# Patient Record
Sex: Male | Born: 1990
Health system: Southern US, Community
[De-identification: ages and names within clinical notes are randomized; demographics above are authoritative.]

## PROBLEM LIST (undated history)

## (undated) DIAGNOSIS — J45909 Unspecified asthma, uncomplicated: Secondary | ICD-10-CM

## (undated) DIAGNOSIS — Z789 Other specified health status: Secondary | ICD-10-CM

## (undated) HISTORY — PX: HERNIA REPAIR: SHX51

## (undated) HISTORY — DX: Unspecified asthma, uncomplicated: J45.909

## (undated) HISTORY — PX: NO PAST SURGERIES: SHX2092

---
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
## 2012-02-24 ENCOUNTER — Encounter (INDEPENDENT_AMBULATORY_CARE_PROVIDER_SITE_OTHER): Payer: Self-pay | Admitting: Surgery

## 2012-02-24 ENCOUNTER — Ambulatory Visit (INDEPENDENT_AMBULATORY_CARE_PROVIDER_SITE_OTHER): Payer: BC Managed Care – PPO | Admitting: Surgery

## 2012-02-24 VITALS — BP 120/62 | HR 64 | Temp 97.7°F | Resp 16 | Ht 67.0 in | Wt 199.4 lb

## 2012-02-24 DIAGNOSIS — K4020 Bilateral inguinal hernia, without obstruction or gangrene, not specified as recurrent: Secondary | ICD-10-CM

## 2012-02-24 NOTE — Progress Notes (Signed)
Patient ID: Jack Jensen, male   DOB: 1990/08/16, 21 y.o.   MRN: [id number]  Chief Complaint  Patient presents with  . New Evaluation    eval ing hermia    HPI Jack Jensen is a 21 y.o. male.   HPI He is a self-referral with bilateral inguinal pain hurting down into the scrotum. This is been occurring for several weeks. He was examined by some physician who felt he had hernia. His mother actually referred him here. He describes the pain as sharp and intermittent. He may have noticed a bulge in the left groin. He has no obstructive symptoms. The pain is mild to moderate in intensity. History reviewed. No pertinent past medical history.  History reviewed. No pertinent past surgical history.  History reviewed. No pertinent family history.  Social History History  Substance Use Topics  . Smoking status: Never Smoker   . Smokeless tobacco: Not on file  . Alcohol Use: Yes     Comment: socially    Allergies  Allergen Reactions  . Cephalexin Hives    Current Outpatient Prescriptions  Medication Sig Dispense Refill  . methylphenidate (RITALIN) 10 MG tablet Take 10 mg by mouth 2 (two) times daily.        Review of Systems Review of Systems  Constitutional: Negative for fever, chills and unexpected weight change.  HENT: Negative for hearing loss, congestion, sore throat, trouble swallowing and voice change.   Eyes: Negative for visual disturbance.  Respiratory: Negative for cough and wheezing.   Cardiovascular: Negative for chest pain, palpitations and leg swelling.  Gastrointestinal: Negative for nausea, vomiting, abdominal pain, diarrhea, constipation, blood in stool, abdominal distention, anal bleeding and rectal pain.  Genitourinary: Negative for hematuria and difficulty urinating.  Musculoskeletal: Negative for arthralgias.  Skin: Negative for rash and wound.  Neurological: Negative for seizures, syncope, weakness and headaches.  Hematological: Negative for adenopathy. Does  not bruise/bleed easily.  Psychiatric/Behavioral: Negative for confusion.    Blood pressure 120/62, pulse 64, temperature 97.7 F (36.5 C), temperature source Temporal, resp. rate 16, height 5\' 7"  (1.702 m), weight 199 lb 6.4 oz (90.447 kg).  Physical Exam Physical Exam  Constitutional: He is oriented to person, place, and time. He appears well-developed and well-nourished. No distress.  HENT:  Head: Normocephalic and atraumatic.  Right Ear: External ear normal.  Left Ear: External ear normal.  Nose: Nose normal.  Mouth/Throat: Oropharynx is clear and moist.  Eyes: Conjunctivae normal are normal. Pupils are equal, round, and reactive to light. Right eye exhibits no discharge. Left eye exhibits no discharge. No scleral icterus.  Neck: Normal range of motion. Neck supple. No tracheal deviation present. No thyromegaly present.  Cardiovascular: Normal rate, regular rhythm, normal heart sounds and intact distal pulses.   No murmur heard. Pulmonary/Chest: Effort normal and breath sounds normal. No respiratory distress. He has no wheezes. He has no rales.  Abdominal: Soft. Bowel sounds are normal. He exhibits no distension. There is no tenderness. There is no rebound and no guarding.       There are very small bilateral inguinal hernias with the left being slightly larger than the right  Musculoskeletal: Normal range of motion. He exhibits no edema and no tenderness.  Lymphadenopathy:    He has no cervical adenopathy.  Neurological: He is alert and oriented to person, place, and time.  Skin: Skin is warm and dry. No rash noted. He is not diaphoretic. No erythema.  Psychiatric: His behavior is normal. Judgment normal.  Data Reviewed   Assessment    Bilateral inguinal hernias    Plan    Repair with mesh was recommended. I discussed the laparoscopic and open techniques. As it is bilateral, I have recommended laparoscopic repair with mesh. I discussed the surgery in detail. I  discussed the risks of surgery which includes but is not limited to bleeding, infection, nerve entrapment, chronic pain, and injury to stranding structures, recurrence, etc. He understands and wishes to proceed. Surgery will be scheduled. Likelihood of success is good       Jack Jensen A 02/24/2012, 3:09 PM

## 2012-03-04 ENCOUNTER — Ambulatory Visit (INDEPENDENT_AMBULATORY_CARE_PROVIDER_SITE_OTHER): Payer: Self-pay | Admitting: General Surgery

## 2012-03-11 ENCOUNTER — Encounter (HOSPITAL_BASED_OUTPATIENT_CLINIC_OR_DEPARTMENT_OTHER): Payer: Self-pay | Admitting: *Deleted

## 2012-03-15 NOTE — H&P (Signed)
Patient ID: Jack Jensen, male DOB: [birth year], 21 y.o. MRN: [id number]  Chief Complaint   Patient presents with   .  New Evaluation     eval ing hermia    HPI  Jack Jensen is a 21 y.o. male.  HPI  He is a self-referral with bilateral inguinal pain hurting down into the scrotum. This is been occurring for several weeks. He was examined by some physician who felt he had hernia. His mother actually referred him here. He describes the pain as sharp and intermittent. He may have noticed a bulge in the left groin. He has no obstructive symptoms. The pain is mild to moderate in intensity.  History reviewed. No pertinent past medical history.  History reviewed. No pertinent past surgical history.  History reviewed. No pertinent family history.  Social History  History   Substance Use Topics   .  Smoking status:  Never Smoker   .  Smokeless tobacco:  Not on file   .  Alcohol Use:  Yes      Comment: socially    Allergies   Allergen  Reactions   .  Cephalexin  Hives    Current Outpatient Prescriptions   Medication  Sig  Dispense  Refill   .  methylphenidate (RITALIN) 10 MG tablet  Take 10 mg by mouth 2 (two) times daily.      Review of Systems  Review of Systems  Constitutional: Negative for fever, chills and unexpected weight change.  HENT: Negative for hearing loss, congestion, sore throat, trouble swallowing and voice change.  Eyes: Negative for visual disturbance.  Respiratory: Negative for cough and wheezing.  Cardiovascular: Negative for chest pain, palpitations and leg swelling.  Gastrointestinal: Negative for nausea, vomiting, abdominal pain, diarrhea, constipation, blood in stool, abdominal distention, anal bleeding and rectal pain.  Genitourinary: Negative for hematuria and difficulty urinating.  Musculoskeletal: Negative for arthralgias.  Skin: Negative for rash and wound.  Neurological: Negative for seizures, syncope, weakness and headaches.  Hematological: Negative for  adenopathy. Does not bruise/bleed easily.  Psychiatric/Behavioral: Negative for confusion.   Blood pressure 120/62, pulse 64, temperature 97.7 F (36.5 C), temperature source Temporal, resp. rate 16, height 5\' 7"  (1.702 m), weight 199 lb 6.4 oz (90.447 kg).  Physical Exam  Physical Exam  Constitutional: He is oriented to person, place, and time. He appears well-developed and well-nourished. No distress.  HENT:  Head: Normocephalic and atraumatic.  Right Ear: External ear normal.  Left Ear: External ear normal.  Nose: Nose normal.  Mouth/Throat: Oropharynx is clear and moist.  Eyes: Conjunctivae normal are normal. Pupils are equal, round, and reactive to light. Right eye exhibits no discharge. Left eye exhibits no discharge. No scleral icterus.  Neck: Normal range of motion. Neck supple. No tracheal deviation present. No thyromegaly present.  Cardiovascular: Normal rate, regular rhythm, normal heart sounds and intact distal pulses.  No murmur heard.  Pulmonary/Chest: Effort normal and breath sounds normal. No respiratory distress. He has no wheezes. He has no rales.  Abdominal: Soft. Bowel sounds are normal. He exhibits no distension. There is no tenderness. There is no rebound and no guarding.  There are very small bilateral inguinal hernias with the left being slightly larger than the right  Musculoskeletal: Normal range of motion. He exhibits no edema and no tenderness.  Lymphadenopathy:  He has no cervical adenopathy.  Neurological: He is alert and oriented to person, place, and time.  Skin: Skin is warm and dry. No rash noted.  He is not diaphoretic. No erythema.  Psychiatric: His behavior is normal. Judgment normal.   Data Reviewed  Assessment   Bilateral inguinal hernias   Plan   Repair with mesh was recommended. I discussed the laparoscopic and open techniques. As it is bilateral, I have recommended laparoscopic repair with mesh. I discussed the surgery in detail. I discussed  the risks of surgery which includes but is not limited to bleeding, infection, nerve entrapment, chronic pain, and injury to stranding structures, recurrence, etc. He understands and wishes to proceed. Surgery will be scheduled. Likelihood of success is good

## 2012-03-16 ENCOUNTER — Encounter (HOSPITAL_BASED_OUTPATIENT_CLINIC_OR_DEPARTMENT_OTHER): Payer: Self-pay | Admitting: *Deleted

## 2012-03-16 ENCOUNTER — Encounter (HOSPITAL_BASED_OUTPATIENT_CLINIC_OR_DEPARTMENT_OTHER)
Admission: RE | Disposition: A | Discharge status: Home / Self Care | Payer: Self-pay | Source: Ambulatory Visit | Attending: Surgery

## 2012-03-16 ENCOUNTER — Encounter (HOSPITAL_BASED_OUTPATIENT_CLINIC_OR_DEPARTMENT_OTHER): Payer: Self-pay | Admitting: Certified Registered"

## 2012-03-16 ENCOUNTER — Encounter (HOSPITAL_BASED_OUTPATIENT_CLINIC_OR_DEPARTMENT_OTHER): Payer: Self-pay | Admitting: Anesthesiology

## 2012-03-16 ENCOUNTER — Ambulatory Visit (HOSPITAL_BASED_OUTPATIENT_CLINIC_OR_DEPARTMENT_OTHER): Payer: BC Managed Care – PPO | Admitting: Anesthesiology

## 2012-03-16 ENCOUNTER — Ambulatory Visit (HOSPITAL_BASED_OUTPATIENT_CLINIC_OR_DEPARTMENT_OTHER)
Admission: RE | Admit: 2012-03-16 | Discharge: 2012-03-16 | Disposition: A | Discharge status: Home / Self Care | Payer: BC Managed Care – PPO | Source: Ambulatory Visit | Attending: Surgery | Admitting: Surgery

## 2012-03-16 DIAGNOSIS — K4020 Bilateral inguinal hernia, without obstruction or gangrene, not specified as recurrent: Secondary | ICD-10-CM | POA: Insufficient documentation

## 2012-03-16 DIAGNOSIS — Z881 Allergy status to other antibiotic agents status: Secondary | ICD-10-CM | POA: Insufficient documentation

## 2012-03-16 HISTORY — PX: INSERTION OF MESH: SHX5868

## 2012-03-16 HISTORY — PX: INGUINAL HERNIA REPAIR: SHX194

## 2012-03-16 HISTORY — DX: Other specified health status: Z78.9

## 2012-03-16 LAB — POCT HEMOGLOBIN-HEMACUE: Hemoglobin: 15 g/dL (ref 13.0–17.0)

## 2012-03-16 SURGERY — REPAIR, HERNIA, INGUINAL, BILATERAL, LAPAROSCOPIC
Anesthesia: General | Site: Groin | Laterality: Bilateral

## 2012-03-16 MED ORDER — ONDANSETRON HCL 4 MG/2ML IJ SOLN: 4.0000 mg | Freq: Once | INTRAMUSCULAR | Status: DC | PRN

## 2012-03-16 MED ORDER — SODIUM CHLORIDE 0.9 % IJ SOLN: 3.0000 mL | Freq: Two times a day (BID) | INTRAMUSCULAR | Status: DC

## 2012-03-16 MED ORDER — DEXAMETHASONE SODIUM PHOSPHATE 4 MG/ML IJ SOLN
INTRAMUSCULAR | Status: DC | PRN
  Administered 2012-03-16: 8 mg via INTRAVENOUS

## 2012-03-16 MED ORDER — HYDROMORPHONE HCL PF 1 MG/ML IJ SOLN
0.2500 mg | INTRAMUSCULAR | Status: DC | PRN
  Administered 2012-03-16 (×4): 0.5 mg via INTRAVENOUS

## 2012-03-16 MED ORDER — SODIUM CHLORIDE 0.9 % IJ SOLN: 3.0000 mL | INTRAMUSCULAR | Status: DC | PRN

## 2012-03-16 MED ORDER — OXYCODONE HCL 5 MG PO TABS: 5.0000 mg | ORAL_TABLET | ORAL | Status: DC | PRN

## 2012-03-16 MED ORDER — CIPROFLOXACIN IN D5W 400 MG/200ML IV SOLN
400.0000 mg | INTRAVENOUS | Status: AC
  Administered 2012-03-16: 400 mg via INTRAVENOUS

## 2012-03-16 MED ORDER — HYDROCODONE-ACETAMINOPHEN 5-325 MG PO TABS: 1.0000 | ORAL_TABLET | ORAL | Status: AC | PRN

## 2012-03-16 MED ORDER — ACETAMINOPHEN 325 MG PO TABS: 650.0000 mg | ORAL_TABLET | ORAL | Status: DC | PRN

## 2012-03-16 MED ORDER — GLYCOPYRROLATE 0.2 MG/ML IJ SOLN
INTRAMUSCULAR | Status: DC | PRN
  Administered 2012-03-16: .7 mg via INTRAVENOUS

## 2012-03-16 MED ORDER — FENTANYL CITRATE 0.05 MG/ML IJ SOLN
INTRAMUSCULAR | Status: DC | PRN
  Administered 2012-03-16: 100 ug via INTRAVENOUS

## 2012-03-16 MED ORDER — SODIUM CHLORIDE 0.9 % IV SOLN: 250.0000 mL | INTRAVENOUS | Status: DC | PRN

## 2012-03-16 MED ORDER — OXYCODONE HCL 5 MG PO TABS: 5.0000 mg | ORAL_TABLET | Freq: Once | ORAL | Status: DC | PRN

## 2012-03-16 MED ORDER — ACETAMINOPHEN 650 MG RE SUPP: 650.0000 mg | RECTAL | Status: DC | PRN

## 2012-03-16 MED ORDER — OXYCODONE HCL 5 MG/5ML PO SOLN: 5.0000 mg | Freq: Once | ORAL | Status: DC | PRN

## 2012-03-16 MED ORDER — KETOROLAC TROMETHAMINE 30 MG/ML IJ SOLN
INTRAMUSCULAR | Status: DC | PRN
  Administered 2012-03-16: 30 mg via INTRAVENOUS

## 2012-03-16 MED ORDER — MORPHINE SULFATE 4 MG/ML IJ SOLN: 4.0000 mg | INTRAMUSCULAR | Status: DC | PRN

## 2012-03-16 MED ORDER — LACTATED RINGERS IV SOLN
INTRAVENOUS | Status: DC
  Administered 2012-03-16: 20 mL/h via INTRAVENOUS
  Administered 2012-03-16 (×2): via INTRAVENOUS

## 2012-03-16 MED ORDER — MIDAZOLAM HCL 5 MG/5ML IJ SOLN
INTRAMUSCULAR | Status: DC | PRN
  Administered 2012-03-16: 2 mg via INTRAVENOUS

## 2012-03-16 MED ORDER — LIDOCAINE HCL (CARDIAC) 20 MG/ML IV SOLN
INTRAVENOUS | Status: DC | PRN
  Administered 2012-03-16: 80 mg via INTRAVENOUS

## 2012-03-16 MED ORDER — ONDANSETRON HCL 4 MG/2ML IJ SOLN
INTRAMUSCULAR | Status: DC | PRN
  Administered 2012-03-16: 4 mg via INTRAVENOUS

## 2012-03-16 MED ORDER — NEOSTIGMINE METHYLSULFATE 1 MG/ML IJ SOLN
INTRAMUSCULAR | Status: DC | PRN
  Administered 2012-03-16: 5.5 mg via INTRAVENOUS

## 2012-03-16 MED ORDER — PROPOFOL 10 MG/ML IV BOLUS
INTRAVENOUS | Status: DC | PRN
  Administered 2012-03-16: 300 mg via INTRAVENOUS

## 2012-03-16 MED ORDER — ROCURONIUM BROMIDE 100 MG/10ML IV SOLN
INTRAVENOUS | Status: DC | PRN
  Administered 2012-03-16: 40 mg via INTRAVENOUS
  Administered 2012-03-16 (×2): 5 mg via INTRAVENOUS

## 2012-03-16 MED ORDER — BUPIVACAINE HCL (PF) 0.5 % IJ SOLN
INTRAMUSCULAR | Status: DC | PRN
  Administered 2012-03-16: 20 mL

## 2012-03-16 MED ORDER — ONDANSETRON HCL 4 MG/2ML IJ SOLN: 4.0000 mg | Freq: Four times a day (QID) | INTRAMUSCULAR | Status: DC | PRN

## 2012-03-16 SURGICAL SUPPLY — 30 items
APPLIER CLIP LOGIC TI 5 (MISCELLANEOUS) IMPLANT
BANDAGE ADHESIVE 1X3 (GAUZE/BANDAGES/DRESSINGS) ×6 IMPLANT
BENZOIN TINCTURE PRP APPL 2/3 (GAUZE/BANDAGES/DRESSINGS) ×2 IMPLANT
BLADE SURG ROTATE 9660 (MISCELLANEOUS) IMPLANT
CANISTER SUCTION 2500CC (MISCELLANEOUS) IMPLANT
CHLORAPREP W/TINT 26ML (MISCELLANEOUS) ×2 IMPLANT
CLOTH BEACON ORANGE TIMEOUT ST (SAFETY) ×2 IMPLANT
DEVICE SECURE STRAP 25 ABSORB (INSTRUMENTS) ×2 IMPLANT
DISSECT BALLN SPACEMKR + OVL (BALLOONS) ×2
DISSECTOR BALLN SPACEMKR + OVL (BALLOONS) ×1 IMPLANT
DISSECTOR BLUNT TIP ENDO 5MM (MISCELLANEOUS) IMPLANT
DRAPE UTILITY XL STRL (DRAPES) ×2 IMPLANT
ELECT REM PT RETURN 9FT ADLT (ELECTROSURGICAL) ×2
ELECTRODE REM PT RTRN 9FT ADLT (ELECTROSURGICAL) ×1 IMPLANT
GLOVE ECLIPSE 6.5 STRL STRAW (GLOVE) ×2 IMPLANT
GLOVE SURG SIGNA 7.5 PF LTX (GLOVE) ×2 IMPLANT
GLOVE SURG SS PI 7.0 STRL IVOR (GLOVE) ×2 IMPLANT
GOWN PREVENTION PLUS XLARGE (GOWN DISPOSABLE) ×6 IMPLANT
MESH 3DMAX LIGHT 4.8X6.7 LT XL (Mesh General) ×2 IMPLANT
MESH 3DMAX LIGHT 4.8X6.7 RT XL (Mesh General) ×2 IMPLANT
NEEDLE INSUFFLATION 14GA 120MM (NEEDLE) IMPLANT
NS IRRIG 1000ML POUR BTL (IV SOLUTION) ×2 IMPLANT
PACK BASIN DAY SURGERY FS (CUSTOM PROCEDURE TRAY) ×2 IMPLANT
SET IRRIG TUBING LAPAROSCOPIC (IRRIGATION / IRRIGATOR) IMPLANT
SET TROCAR LAP APPLE-HUNT 5MM (ENDOMECHANICALS) ×2 IMPLANT
SLEEVE SCD COMPRESS KNEE MED (MISCELLANEOUS) ×2 IMPLANT
SUT MNCRL AB 4-0 PS2 18 (SUTURE) ×2 IMPLANT
TOWEL OR 17X24 6PK STRL BLUE (TOWEL DISPOSABLE) ×2 IMPLANT
TRAY FOLEY CATH 14FR (SET/KITS/TRAYS/PACK) ×2 IMPLANT
TRAY LAPAROSCOPIC (CUSTOM PROCEDURE TRAY) ×2 IMPLANT

## 2012-03-16 NOTE — Op Note (Signed)
LAPAROSCOPIC BILATERAL INGUINAL HERNIA REPAIR, INSERTION OF MESH  Procedure Note  KAILER HEINDEL 03/16/2012   Pre-op Diagnosis: bilateral ingunial hernia     Post-op Diagnosis: same  Procedure(s): LAPAROSCOPIC BILATERAL INGUINAL HERNIA REPAIR INSERTION OF MESH  Surgeon(s): Shelly Rubenstein, MD  Anesthesia: General  Staff:  Jay Schlichter, RN - Circulator Salley Scarlet, RN - Scrub Person  Estimated Blood Loss: Minimal                         Hilton Saephan A   Date: 03/16/2012  Time: 5:04 PM

## 2012-03-16 NOTE — Progress Notes (Signed)
Dr. Ivin Booty in to speak to patient and to patients father.  Instructed to notify Dr. Magnus Ivan if patient unable to void by midnight tonight.

## 2012-03-16 NOTE — Anesthesia Postprocedure Evaluation (Signed)
  Anesthesia Post-op Note  Patient: Jack Jensen  Procedure(s) Performed: Procedure(s) (LRB) with comments: LAPAROSCOPIC BILATERAL INGUINAL HERNIA REPAIR (Bilateral) INSERTION OF MESH (Bilateral)  Patient Location: PACU  Anesthesia Type:General  Level of Consciousness: awake, alert  and oriented  Airway and Oxygen Therapy: Patient Spontanous Breathing  Post-op Pain: mild  Post-op Assessment: Post-op Vital signs reviewed  Post-op Vital Signs: Reviewed  Complications: No apparent anesthesia complications

## 2012-03-16 NOTE — Transfer of Care (Signed)
Immediate Anesthesia Transfer of Care Note  Patient: Jack Jensen  Procedure(s) Performed: Procedure(s) (LRB) with comments: LAPAROSCOPIC BILATERAL INGUINAL HERNIA REPAIR (Bilateral) INSERTION OF MESH (Bilateral)  Patient Location: PACU  Anesthesia Type:General  Level of Consciousness: sedated and unresponsive  Airway & Oxygen Therapy: Patient Spontanous Breathing and Patient connected to face mask oxygen  Post-op Assessment: Report given to PACU RN and Post -op Vital signs reviewed and stable  Post vital signs: Reviewed and stable  Complications: No apparent anesthesia complications

## 2012-03-16 NOTE — Anesthesia Procedure Notes (Signed)
Procedure Name: Intubation Date/Time: 03/16/2012 4:09 PM Performed by: Verlan Friends Pre-anesthesia Checklist: Patient identified, Emergency Drugs available, Suction available, Patient being monitored and Timeout performed Patient Re-evaluated:Patient Re-evaluated prior to inductionOxygen Delivery Method: Circle System Utilized Preoxygenation: Pre-oxygenation with 100% oxygen Intubation Type: IV induction Ventilation: Mask ventilation without difficulty Laryngoscope Size: Miller and 3 Grade View: Grade I Tube type: Oral Number of attempts: 1 Airway Equipment and Method: stylet and oral airway Placement Confirmation: ETT inserted through vocal cords under direct vision,  positive ETCO2 and breath sounds checked- equal and bilateral Secured at: 22 cm Tube secured with: Tape Dental Injury: Teeth and Oropharynx as per pre-operative assessment

## 2012-03-16 NOTE — Anesthesia Preprocedure Evaluation (Signed)
Anesthesia Evaluation  Patient identified by MRN, date of birth, ID band Patient awake    Reviewed: Allergy & Precautions, H&P , NPO status , Patient's Chart, lab work & pertinent test results  Airway Mallampati: II TM Distance: >3 FB Neck ROM: Full    Dental  (+) Teeth Intact and Dental Advisory Given   Pulmonary  breath sounds clear to auscultation        Cardiovascular Rhythm:Regular Rate:Normal     Neuro/Psych    GI/Hepatic   Endo/Other    Renal/GU      Musculoskeletal   Abdominal   Peds  Hematology   Anesthesia Other Findings   Reproductive/Obstetrics                           Anesthesia Physical Anesthesia Plan  ASA: I  Anesthesia Plan: General   Post-op Pain Management:    Induction: Intravenous  Airway Management Planned: Oral ETT  Additional Equipment:   Intra-op Plan:   Post-operative Plan: Extubation in OR  Informed Consent: I have reviewed the patients History and Physical, chart, labs and discussed the procedure including the risks, benefits and alternatives for the proposed anesthesia with the patient or authorized representative who has indicated his/her understanding and acceptance.   Dental advisory given  Plan Discussed with: CRNA, Anesthesiologist and Surgeon  Anesthesia Plan Comments:         Anesthesia Quick Evaluation

## 2012-03-16 NOTE — Interval H&P Note (Signed)
History and Physical Interval Note: no change in H and P  03/16/2012 3:56 PM  Jack Jensen  has presented today for surgery, with the diagnosis of bilateral ingunial hernia  The various methods of treatment have been discussed with the patient and family. After consideration of risks, benefits and other options for treatment, the patient has consented to  Procedure(s) (LRB) with comments: LAPAROSCOPIC BILATERAL INGUINAL HERNIA REPAIR (Bilateral) INSERTION OF MESH (Bilateral) as a surgical intervention .  The patient's history has been reviewed, patient examined, no change in status, stable for surgery.  I have reviewed the patient's chart and labs.  Questions were answered to the patient's satisfaction.     [name] A

## 2012-03-17 NOTE — Op Note (Signed)
NAMERHYTHM, WIGFALL NO.:  [id number]  MEDICAL RECORD NO.:  [id number]  LOCATION:                                 FACILITY:  PHYSICIAN:  Abigail Miyamoto, M.D. DATE OF BIRTH:  1991-02-03  DATE OF PROCEDURE:  03/16/2012 DATE OF DISCHARGE:                              OPERATIVE REPORT   PREOPERATIVE DIAGNOSIS:  Bilateral inguinal hernia.  POSTOPERATIVE DIAGNOSIS:  Bilateral inguinal hernia.  PROCEDURES:  Laparoscopic bilateral inguinal hernia repair with mesh.  SURGEON:  Abigail Miyamoto, M.D.  ANESTHESIA:  General and 0.5% Marcaine.  ESTIMATED BLOOD LOSS:  Minimal.  FINDINGS:  The patient was found to have 2 indirect inguinal hernias. They were repaired with 2 separate pieces of 3D light Bard Prolene mesh.  PROCEDURE IN DETAIL:  The patient was brought to the operating room, identified as Jack Jensen.  He was placed supine on the operative table and general anesthesia was induced.  A Foley catheter was then inserted. His abdomen was then prepped and draped in usual sterile fashion.  I made a small transverse incision at the lower edge of the umbilicus.  I took this down the fascia, which was then opened with a scalpel.  The rectus muscle was identified and elevated.  The dissecting balloon was then passed underneath the rectus sheath and manipulated towards the pubis under direct vision.  The dissecting balloon was then insufflated dissecting out the preperitoneal space.  The dissecting balloon was then removed and insufflation was begun with carbon dioxide.  Two 5-mm ports were then placed in the patient's lower midline under direct vision.  I dissected out the right inguinal area first.  The patient had a small indirect hernia sac, which I was able to easily reduce from the cord structures.  Cooper's ligament was easily identified.  I then dissected out the left inguinal area.  The patient had a large hernia sac involving the cord as well, which was  easily reduced.  There was no evidence of direct hernia on either side.  Once the left-sided sac was completely reduced, I brought out a left-sided 3D light Bard Prolene mesh onto the field.  I placed it through the umbilical port and opened it as an onlay on the inguinal floor.  I then tacked it with the secure strap absorbable tacker to Cooper ligament up the medial abdominal wall and slightly laterally.  Another large piece of mesh were brought onto the field.  I used a right-sided piece of mesh and placed it through the umbilical port and then opened it as an onlay on the right groin.  I then tacked it to Cooper's ligament up the medial abdominal wall and out laterally as well.  Good coverage of both inguinal floors and cord structures appeared to be achieved.  Hemostasis also appeared to be achieved.  I removed the midline ports and the mesh appeared to lie appropriately as these preperitoneal space collapsed.  I then closed the patient's fascia at the umbilicus with a figure-of-eight 0-Vicryl suture.  All skin incisions were anesthetized with Marcaine and ilioinguinal nerve blocks were performed bilaterally with Marcaine as well.  The incisions were then closed with  4-0 Monocryl subcuticular sutures.  Steri-Strips and Band-Aids were then applied.  The patient tolerated the procedure well. All sponge, needle, and instrument counts were correct at the end of the procedure.  The patient was then extubated in the operating room and taken in a stable condition to the recovery room.     Abigail Miyamoto, M.D.     DB/MEDQ  D:  03/16/2012  T:  03/17/2012  Job:  [id number]

## 2012-03-20 ENCOUNTER — Encounter (HOSPITAL_BASED_OUTPATIENT_CLINIC_OR_DEPARTMENT_OTHER): Payer: Self-pay | Admitting: Surgery

## 2012-04-07 ENCOUNTER — Encounter (INDEPENDENT_AMBULATORY_CARE_PROVIDER_SITE_OTHER): Payer: Self-pay | Admitting: Surgery

## 2012-04-07 ENCOUNTER — Ambulatory Visit (INDEPENDENT_AMBULATORY_CARE_PROVIDER_SITE_OTHER): Payer: BC Managed Care – PPO | Admitting: Surgery

## 2012-04-07 VITALS — BP 120/77 | HR 82 | Temp 98.6°F | Resp 14 | Ht 67.0 in | Wt 200.8 lb

## 2012-04-07 DIAGNOSIS — Z09 Encounter for follow-up examination after completed treatment for conditions other than malignant neoplasm: Secondary | ICD-10-CM

## 2012-04-07 NOTE — Progress Notes (Signed)
Subjective:     Patient ID: Jack Jensen, male   DOB: [birth year], 22 y.o.   MRN: [id number]  HPI He is here for his first postop visit status post laparoscopic bilateral inguinal hernia repairs with mesh. He is doing well and has no complaints.  Review of Systems     Objective:   Physical Exam On exam, his incisions are well healed and there is no evidence of recurrent hernia    Assessment:     Patient stable postop    Plan:     He may resume normal activity including lifting. I will see him back as needed

## 2014-03-18 ENCOUNTER — Ambulatory Visit (INDEPENDENT_AMBULATORY_CARE_PROVIDER_SITE_OTHER): Payer: BC Managed Care – PPO | Admitting: Family Medicine

## 2014-03-18 VITALS — BP 110/62 | HR 62 | Temp 98.2°F | Resp 16 | Ht 66.25 in | Wt 198.0 lb

## 2014-03-18 DIAGNOSIS — Z7189 Other specified counseling: Secondary | ICD-10-CM

## 2014-03-18 DIAGNOSIS — Z7184 Encounter for health counseling related to travel: Secondary | ICD-10-CM

## 2014-03-18 DIAGNOSIS — Z23 Encounter for immunization: Secondary | ICD-10-CM

## 2014-03-18 DIAGNOSIS — Z111 Encounter for screening for respiratory tuberculosis: Secondary | ICD-10-CM

## 2014-03-18 DIAGNOSIS — Z114 Encounter for screening for human immunodeficiency virus [HIV]: Secondary | ICD-10-CM

## 2014-03-18 DIAGNOSIS — Z0000 Encounter for general adult medical examination without abnormal findings: Secondary | ICD-10-CM

## 2014-03-18 MED ORDER — TYPHOID VACCINE PO CPDR: 1.0000 | DELAYED_RELEASE_CAPSULE | ORAL | Status: DC

## 2014-03-18 NOTE — Patient Instructions (Signed)
I will send you a copy of your labs in the mail asap.  Our mail is slow!  If you want your results faster sign up for mychart and your can print them at home You got your tetanus shot today.  Otherwise you seem to be all up to date on shots.   Take the oral typhoid vaccine at least 1 week prior to leaving for your trip (asap).

## 2014-03-18 NOTE — Progress Notes (Signed)
[facility] [address], [place] [address] [phone]  Date:  03/18/2014   Name:  Jack Jensen   DOB:  10/06/1990   MRN:  [id number]  PCP:  Duard BradyPUDLO,RONALD J, MD    Chief Complaint: Annual Exam   History of Present Illness:  Jack Khanathan F Oestreicher is a 23 y.o. very pleasant male patient who presents with the following:  Here today as a new patient for a CPE- he is planning to enter the [employer] and has physical forms for completion.  [place] will be [place] to 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    He had a hernia repair about 2 years ago- no longer troublesome. Otherwise he denies any significant medical history, no current concerns.  He is able to exercise without difficulty.   He has had all his immunizations per the appropriate schedule.  He was told that he needs another tetanus shot as his will run out in the next few years.  He has had hep a and hep b series as well as HPV and all other expected immunizations   He would like to have oral thyroid vaccine as well  Patient Active Problem List   Diagnosis Date Noted  . Bilateral inguinal hernia 02/24/2012    Past Medical History  Diagnosis Date  . No pertinent past medical history     Past Surgical History  Procedure Laterality Date  . No past surgeries    . Inguinal hernia repair  03/16/2012    Procedure: LAPAROSCOPIC BILATERAL INGUINAL HERNIA REPAIR;  Surgeon: Shelly Rubensteinouglas A Blackman, MD;  Location: [facility];  Service: General;  Laterality: Bilateral;  . Insertion of mesh  03/16/2012    Procedure: INSERTION OF MESH;  Surgeon: Shelly Rubensteinouglas A Blackman, MD;  Location: [facility];  Service: General;  Laterality: Bilateral;  . Hernia repair      History  Substance Use Topics  . Smoking status: Never Smoker   . Smokeless tobacco: Not on file  . Alcohol Use: Yes     Comment: socially    History reviewed. No pertinent family  history.  Allergies  Allergen Reactions  . Cephalexin Hives    Medication list has been reviewed and updated.  No current outpatient prescriptions on file prior to visit.   No current facility-administered medications on file prior to visit.    Review of Systems:  As per HPI- otherwise negative.   Physical Examination: Filed Vitals:   03/18/14 1253  BP: 110/62  Pulse: 62  Temp: 98.2 F (36.8 C)  Resp: 16   Filed Vitals:   03/18/14 1253  Height: 5' 6.25" (1.683 m)  Weight: 198 lb (89.812 kg)   Body mass index is 31.71 kg/(m^2). Ideal Body Weight: Weight in (lb) to have BMI = 25: 155.7  GEN: WDWN, NAD, Non-toxic, A & O x 3, looks well HEENT: Atraumatic, Normocephalic. Neck supple. No masses, No LAD.  Bilateral TM wnl, oropharynx normal.  PEERL,EOMI.   Ears and Nose: No external deformity. CV: RRR, No M/G/R. No JVD. No thrill. No extra heart sounds. PULM: CTA B, no wheezes, crackles, rhonchi. No retractions. No resp. distress. No accessory muscle use. ABD: S, NT, ND, +BS. No rebound. No HSM. EXTR: No c/c/e NEURO Normal gait.  PSYCH: Normally interactive. Conversant. Not depressed or anxious appearing.  Calm demeanor.  GU: no masses or abnormality.  No evidence of recurrent inguinal hernia  Assessment and Plan: Physical exam  Travel advice encounter - Plan: Basic metabolic panel, HIV antibody, Hepatitis B surface antibody, Hepatitis B surface antigen, Hepatitis C antibody, CBC with Differential, typhoid (VIVOTIF BERNA VACCINE) DR capsule  Screening for tuberculosis - Plan: Quantiferon tb gold assay  Screening for HIV (human immunodeficiency virus) - Plan: HIV antibody  Immunization due - Plan: Td vaccine greater than or equal to 7yo preservative free IM  Labs and vaccines as above for [employer].   See patient instructions for more details.     Signed Abbe AmsterdamJessica Somya Jauregui, MD

## 2014-03-19 LAB — CBC WITH DIFFERENTIAL/PLATELET
Basophils Absolute: 0.1 10*3/uL (ref 0.0–0.1)
Basophils Relative: 1 % (ref 0–1)
Eosinophils Absolute: 0.2 10*3/uL (ref 0.0–0.7)
Eosinophils Relative: 4 % (ref 0–5)
HCT: 42.2 % (ref 39.0–52.0)
Hemoglobin: 14.8 g/dL (ref 13.0–17.0)
Lymphocytes Relative: 44 % (ref 12–46)
Lymphs Abs: 2.4 10*3/uL (ref 0.7–4.0)
MCH: 30.8 pg (ref 26.0–34.0)
MCHC: 35.1 g/dL (ref 30.0–36.0)
MCV: 87.9 fL (ref 78.0–100.0)
MPV: 9.5 fL (ref 9.4–12.4)
Monocytes Absolute: 0.4 10*3/uL (ref 0.1–1.0)
Monocytes Relative: 8 % (ref 3–12)
Neutro Abs: 2.4 10*3/uL (ref 1.7–7.7)
Neutrophils Relative %: 43 % (ref 43–77)
Platelets: 346 10*3/uL (ref 150–400)
RBC: 4.8 MIL/uL (ref 4.22–5.81)
RDW: 15 % (ref 11.5–15.5)
WBC: 5.5 10*3/uL (ref 4.0–10.5)

## 2014-03-19 LAB — HEPATITIS C ANTIBODY: HCV Ab: NEGATIVE

## 2014-03-19 LAB — BASIC METABOLIC PANEL
BUN: 18 mg/dL (ref 6–23)
CO2: 26 mEq/L (ref 19–32)
Calcium: 10.4 mg/dL (ref 8.4–10.5)
Chloride: 103 mEq/L (ref 96–112)
Creat: 1.02 mg/dL (ref 0.50–1.35)
Glucose, Bld: 70 mg/dL (ref 70–99)
Potassium: 4.3 mEq/L (ref 3.5–5.3)
Sodium: 139 mEq/L (ref 135–145)

## 2014-03-19 LAB — HEPATITIS B SURFACE ANTIGEN: Hepatitis B Surface Ag: NEGATIVE

## 2014-03-19 LAB — HIV ANTIBODY (ROUTINE TESTING W REFLEX): HIV 1&2 Ab, 4th Generation: NONREACTIVE

## 2014-03-19 LAB — HEPATITIS B SURFACE ANTIBODY, QUANTITATIVE: Hepatitis B-Post: 1.2 m[IU]/mL

## 2014-03-21 ENCOUNTER — Encounter: Payer: Self-pay | Admitting: Family Medicine

## 2014-03-21 LAB — QUANTIFERON TB GOLD ASSAY (BLOOD)
Interferon Gamma Release Assay: NEGATIVE
Mitogen value: 9.8 IU/mL
Quantiferon Nil Value: 0.03 IU/mL
Quantiferon Tb Ag Minus Nil Value: 0.02 IU/mL
TB Ag value: 0.05 IU/mL

## 2014-04-12 ENCOUNTER — Other Ambulatory Visit: Payer: Self-pay

## 2014-04-12 DIAGNOSIS — Z7184 Encounter for health counseling related to travel: Secondary | ICD-10-CM

## 2014-04-12 MED ORDER — TYPHOID VACCINE PO CPDR: 1.0000 | DELAYED_RELEASE_CAPSULE | ORAL | Status: DC

## 2014-04-12 NOTE — Telephone Encounter (Signed)
Called and his dad answered the phone.  He reports that Jack Jensen has not yet picked up the rx- it seems it never made it to his drug store when I Erx'd it last month.  Will send over again

## 2014-04-12 NOTE — Telephone Encounter (Signed)
Patient need tyhpoid sen to [facility] on [address]. Patient phone: [phone]

## 2014-04-12 NOTE — Telephone Encounter (Signed)
Dr Copland, do you want to RF? 

## 2015-10-23 ENCOUNTER — Ambulatory Visit (INDEPENDENT_AMBULATORY_CARE_PROVIDER_SITE_OTHER): Payer: BLUE CROSS/BLUE SHIELD | Admitting: Family Medicine

## 2015-10-23 VITALS — BP 124/80 | HR 72 | Temp 97.7°F | Resp 18 | Ht 66.25 in | Wt 216.0 lb

## 2015-10-23 DIAGNOSIS — Z9189 Other specified personal risk factors, not elsewhere classified: Secondary | ICD-10-CM | POA: Diagnosis not present

## 2015-10-23 DIAGNOSIS — F909 Attention-deficit hyperactivity disorder, unspecified type: Secondary | ICD-10-CM | POA: Diagnosis not present

## 2015-10-23 NOTE — Patient Instructions (Addendum)
Clearence is given for the psychiatrist to treat patient with stimulant medication. He is advised to avoid excessive caffeine when using those medications with his prior history.    IF you received an x-ray today, you will receive an invoice from [facility]. Please contact [facility] at [phone] with questions or concerns regarding your invoice.   IF you received labwork today, you will receive an invoice from [employer]. Please contact [facility] at [phone] with questions or concerns regarding your invoice.   Our billing staff will not be able to assist you with questions regarding bills from these companies.  You will be contacted with the lab results as soon as they are available. The fastest way to get your results is to activate your My Chart account. Instructions are located on the last page of this paperwork. If you have not heard from [place] regarding the results in 2 weeks, please contact this office.

## 2015-10-23 NOTE — Progress Notes (Signed)
Patient ID: Jack Jensen, male    DOB: [birth year]  Age: 25 y.o. MRN: [id number]  Chief Complaint  Patient presents with  . Other    EKG    Subjective:  Office consultation per request of the psychiatrist office. Consultation request received on a prescription pad. 25 year old male who is here for an EKG. He is seeing someone at the [facility] here in [place] and they are contemplating psychotropic medication with Ritalin for him, requested an EKG with interpretation for cardiac clearance, to be faxed to [phone]. There is a signed request for that order but I cannot read the signature. Apparently it was Oneta Rack PA whom he saw.  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  Not on any regular medicines except for occasional antiparasitic medicine  Current allergies, medications, problem list, past/family and social histories reviewed.  Objective:  BP 124/80   Pulse 72   Temp 97.7 F (36.5 C) (Oral)   Resp 18   Ht 5' 6.25" (1.683 m)   Wt 216 lb (98 kg)   SpO2 97%   BMI 34.60 kg/m   Chest clear. Heart regular without murmur.  Assessment & Plan:   Assessment: 1. At risk for side effect of medication   2. Attention deficit hyperactivity disorder (ADHD), unspecified ADHD type       Plan: EKG was done and will be faxed to his psychiatrist  EKG has normal rate and rhythm, normal intervals, no ectopy, and is felt to be entirely within normal limits.  Orders Placed This Encounter  Procedures  . EKG 12-Lead    No orders of the defined types were placed in this encounter.        Patient Instructions   Clearence is given for the psychiatrist to treat patient with stimulant medication. He is advised to avoid excessive caffeine when using those medications with his prior history.    IF  you received an x-ray today, you will receive an invoice from [facility]. Please contact [facility] at [phone] with questions or concerns regarding your invoice.   IF you received labwork today, you will receive an invoice from [employer] [facility]. Please contact [facility] at [phone] with questions or concerns regarding your invoice.   Our billing staff will not be able to assist you with questions regarding bills from these companies.  You will be contacted with the lab results as soon as they are available. The fastest way to get your results is to activate your My Chart account. Instructions are located on the last page of this paperwork. If you have not heard from [place] regarding the results in 2 weeks, please contact this office.        No Follow-up on file.   HOPPER,DAVID, MD 10/23/2015

## 2016-04-11 DIAGNOSIS — J309 Allergic rhinitis, unspecified: Secondary | ICD-10-CM | POA: Diagnosis not present

## 2016-04-11 DIAGNOSIS — F419 Anxiety disorder, unspecified: Secondary | ICD-10-CM | POA: Diagnosis not present

## 2016-04-11 DIAGNOSIS — R05 Cough: Secondary | ICD-10-CM | POA: Diagnosis not present

## 2016-04-11 DIAGNOSIS — R002 Palpitations: Secondary | ICD-10-CM | POA: Diagnosis not present

## 2016-04-11 DIAGNOSIS — J4520 Mild intermittent asthma, uncomplicated: Secondary | ICD-10-CM | POA: Diagnosis not present

## 2016-05-22 DIAGNOSIS — F4320 Adjustment disorder, unspecified: Secondary | ICD-10-CM | POA: Diagnosis not present

## 2016-05-30 DIAGNOSIS — F4323 Adjustment disorder with mixed anxiety and depressed mood: Secondary | ICD-10-CM | POA: Diagnosis not present

## 2016-06-06 DIAGNOSIS — F901 Attention-deficit hyperactivity disorder, predominantly hyperactive type: Secondary | ICD-10-CM | POA: Diagnosis not present

## 2016-06-13 DIAGNOSIS — F901 Attention-deficit hyperactivity disorder, predominantly hyperactive type: Secondary | ICD-10-CM | POA: Diagnosis not present

## 2016-06-20 DIAGNOSIS — F901 Attention-deficit hyperactivity disorder, predominantly hyperactive type: Secondary | ICD-10-CM | POA: Diagnosis not present

## 2016-06-27 DIAGNOSIS — F901 Attention-deficit hyperactivity disorder, predominantly hyperactive type: Secondary | ICD-10-CM | POA: Diagnosis not present

## 2016-07-11 DIAGNOSIS — F901 Attention-deficit hyperactivity disorder, predominantly hyperactive type: Secondary | ICD-10-CM | POA: Diagnosis not present

## 2016-07-18 DIAGNOSIS — F901 Attention-deficit hyperactivity disorder, predominantly hyperactive type: Secondary | ICD-10-CM | POA: Diagnosis not present

## 2016-07-25 DIAGNOSIS — F901 Attention-deficit hyperactivity disorder, predominantly hyperactive type: Secondary | ICD-10-CM | POA: Diagnosis not present

## 2016-09-02 DIAGNOSIS — S73191A Other sprain of right hip, initial encounter: Secondary | ICD-10-CM | POA: Diagnosis not present

## 2016-10-09 ENCOUNTER — Ambulatory Visit (INDEPENDENT_AMBULATORY_CARE_PROVIDER_SITE_OTHER): Payer: Worker's Compensation | Admitting: Physician Assistant

## 2016-10-09 ENCOUNTER — Ambulatory Visit (INDEPENDENT_AMBULATORY_CARE_PROVIDER_SITE_OTHER): Payer: Worker's Compensation

## 2016-10-09 ENCOUNTER — Ambulatory Visit (HOSPITAL_BASED_OUTPATIENT_CLINIC_OR_DEPARTMENT_OTHER)
Admission: RE | Admit: 2016-10-09 | Discharge: 2016-10-09 | Disposition: A | Discharge status: Home / Self Care | Payer: BLUE CROSS/BLUE SHIELD | Source: Ambulatory Visit | Attending: Physician Assistant | Admitting: Physician Assistant

## 2016-10-09 ENCOUNTER — Encounter: Payer: Self-pay | Admitting: Physician Assistant

## 2016-10-09 VITALS — BP 114/71 | HR 72 | Temp 98.8°F | Resp 16 | Ht 66.25 in | Wt 225.0 lb

## 2016-10-09 DIAGNOSIS — W19XXXA Unspecified fall, initial encounter: Secondary | ICD-10-CM

## 2016-10-09 DIAGNOSIS — M25512 Pain in left shoulder: Secondary | ICD-10-CM

## 2016-10-09 DIAGNOSIS — W12XXXA Fall on and from scaffolding, initial encounter: Secondary | ICD-10-CM | POA: Diagnosis not present

## 2016-10-09 DIAGNOSIS — M542 Cervicalgia: Secondary | ICD-10-CM | POA: Diagnosis not present

## 2016-10-09 DIAGNOSIS — S0990XA Unspecified injury of head, initial encounter: Secondary | ICD-10-CM | POA: Insufficient documentation

## 2016-10-09 DIAGNOSIS — M25522 Pain in left elbow: Secondary | ICD-10-CM

## 2016-10-09 DIAGNOSIS — M25532 Pain in left wrist: Secondary | ICD-10-CM

## 2016-10-09 DIAGNOSIS — R51 Headache: Secondary | ICD-10-CM | POA: Diagnosis not present

## 2016-10-09 DIAGNOSIS — R519 Headache, unspecified: Secondary | ICD-10-CM

## 2016-10-09 NOTE — Patient Instructions (Addendum)
  [facility] will notify you of results   IF you received an x-ray today, you will receive an invoice from [facility]. Please contact [facility] at [phone] with questions or concerns regarding your invoice.   IF you received labwork today, you will receive an invoice from [facility]. Please contact [facility] at [phone] with questions or concerns regarding your invoice.   Our billing staff will not be able to assist you with questions regarding bills from these companies.  You will be contacted with the lab results as soon as they are available. The fastest way to get your results is to activate your My Chart account. Instructions are located on the last page of this paperwork. If you have not heard from us regarding the results in 2 weeks, please contact this office.

## 2016-10-09 NOTE — Progress Notes (Signed)
MRN: [id number] DOB: Dec 20, 1990  Subjective:   Jack Jensen is a 26 y.o. male presenting for chief complaint of Injury (Pt fell about 5 feet at work today); Injury (Fell head first - left side head pain ); and Wrist Injury (L. wrist/elbow/shoulder pain ) Works at [employer]. Fell off of 4 foot scaffold and tumbled down stairs. Hit his head directly. Denies loss of consciousness.  Also hit his left shoulder, elbow, and wrist during the landing. This occurred about one hour prior to arrival. Has pain in his left wrist, elbow, and shoulder. Has minor headache and dizziness. Denies nausea, vomiting, confusion, blurred vision, memory loss, tinnitus, neck pain, and loss of ROM. Has not tried anything for relief.   Review of Systems  Respiratory: Negative for cough and shortness of breath.   Cardiovascular: Negative for chest pain and palpitations.  Neurological: Negative for tingling, sensory change, speech change and focal weakness.    Jack Jensen's medications list, allergies, past medical history and past surgical history were reviewed and excluded from this note due to being a worker's comp case.   Objective:   Vitals: BP 114/71   Pulse 72   Temp 98.8 F (37.1 C) (Oral)   Resp 16   Ht 5' 6.25" (1.683 m)   Wt 225 lb (102.1 kg)   SpO2 97%   BMI 36.04 kg/m   Physical Exam  Constitutional: He is oriented to person, place, and time. He appears well-developed and well-nourished.  HENT:  Head: Normocephalic and atraumatic.  Eyes: Conjunctivae are normal.  Neck: Normal range of motion and full passive range of motion without pain. No spinous process tenderness and no muscular tenderness present. No erythema and normal range of motion present.  Cardiovascular: Normal rate, regular rhythm and normal heart sounds.   Pulmonary/Chest: Effort normal and breath sounds normal. He has no wheezes. He has no rhonchi. He has no rales.  Musculoskeletal:       Right shoulder: Normal.       Left  shoulder: He exhibits decreased range of motion, bony tenderness (most notable at spine of scapula) and swelling (mild).       Right elbow: Normal.      Left elbow: Tenderness found. Medial epicondyle and lateral epicondyle tenderness noted.       Right wrist: Normal.       Left wrist: He exhibits decreased range of motion, bony tenderness and swelling (mild).       Cervical back: Normal. He exhibits normal range of motion and no tenderness.       Thoracic back: Normal.       Lumbar back: Normal.       Right upper arm: Normal.       Left upper arm: Normal.       Right forearm: Normal.       Left forearm: He exhibits bony tenderness. He exhibits no swelling.       Right hand: Normal.       Left hand: Normal.  Neurological: He is alert and oriented to person, place, and time. He has normal strength. No cranial nerve deficit or sensory deficit. He displays a negative Romberg sign.  Skin: Skin is warm and dry.     Psychiatric: He has a normal mood and affect.  Vitals reviewed.   No results found for this or any previous visit (from the past 24 hour(s)).  Assessment and Plan :  1. Fall with injury, initial encounter Neuro exam stable.  Plain films reassuring. No evidence of fracture. Due to mechanism of fall, will send for STAT CT head. Pt instructed that we will contact him with lab results and discuss further tx plan. If CT head normal, plan for follow up on 10/11/16.  - DG Cervical Spine Complete; Future - DG ELBOW COMPLETE LEFT (3+VIEW); Future - DG Shoulder Left; Future - DG Forearm Left; Future - DG Wrist 2 Views Left; Future - CT Head Wo Contrast; Future  2. Nonintractable headache, unspecified chronicity pattern, unspecified headache type 3. Left wrist pain 4. Left elbow pain 5. Acute pain of left shoulder   Benjiman CoreBrittany Kaeleen Odom, PA-C  [facility] at [facility] 10/11/2016 8:12 AM

## 2016-10-11 ENCOUNTER — Ambulatory Visit (INDEPENDENT_AMBULATORY_CARE_PROVIDER_SITE_OTHER): Payer: Worker's Compensation | Admitting: Physician Assistant

## 2016-10-11 ENCOUNTER — Encounter: Payer: Self-pay | Admitting: Physician Assistant

## 2016-10-11 VITALS — BP 124/76 | HR 70 | Temp 97.9°F | Resp 18 | Ht 66.25 in | Wt 220.6 lb

## 2016-10-11 DIAGNOSIS — F0781 Postconcussional syndrome: Secondary | ICD-10-CM | POA: Diagnosis not present

## 2016-10-11 DIAGNOSIS — M25532 Pain in left wrist: Secondary | ICD-10-CM | POA: Diagnosis not present

## 2016-10-11 DIAGNOSIS — M62838 Other muscle spasm: Secondary | ICD-10-CM | POA: Diagnosis not present

## 2016-10-11 DIAGNOSIS — W19XXXD Unspecified fall, subsequent encounter: Secondary | ICD-10-CM | POA: Diagnosis not present

## 2016-10-11 MED ORDER — NAPROXEN 500 MG PO TABS: 500.0000 mg | ORAL_TABLET | Freq: Two times a day (BID) | ORAL | 0 refills | Status: DC

## 2016-10-11 MED ORDER — CYCLOBENZAPRINE HCL 5 MG PO TABS: 5.0000 mg | ORAL_TABLET | Freq: Three times a day (TID) | ORAL | 0 refills | Status: DC | PRN

## 2016-10-11 NOTE — Progress Notes (Signed)
MRN: [id number] DOB: 10/21/1990  Subjective:   Jack Jensen is a 26 y.o. male presenting for chief complaint of Arm Injury (follow up, still having pain and limited motion along with wrist )  Pt initially seen on 10/09/16 after fall from >274ft scaffold at work with headache, left wrist, elbow, and shoulder pain. Neuro exam stable. Plain films of cervical spine, left wrist, forearm, elbow, and shoulder were negative for acute fracture or dislocation. CT head negative for acute findings. Pt instructed to remain out of work until follow up today. Today, pt still having left wrist pain and soreness on his left side. Has not tried anything for rellief. Has mild headache. Denies nausea, vomiting, confusion, blurred vision, memory loss, tinnitus, neck pain, numbness, tingling, weakness, and loss of ROM.  Alann's medications list, allergies, past medical history and past surgical history were reviewed and excluded from this note due to being a worker's comp case.   Objective:   Vitals: BP 124/76   Pulse 70   Temp 97.9 F (36.6 C) (Oral)   Resp 18   Ht 5' 6.25" (1.683 m)   Wt 220 lb 9.6 oz (100.1 kg)   SpO2 96%   BMI 35.34 kg/m   Physical Exam  Constitutional: He is oriented to person, place, and time. He appears well-developed and well-nourished.  HENT:  Head: Normocephalic and atraumatic.  Eyes: Pupils are equal, round, and reactive to light. Conjunctivae and EOM are normal.  Neck: Normal range of motion.  Pulmonary/Chest: Effort normal.  Musculoskeletal:       Left shoulder: He exhibits normal range of motion, no tenderness and no swelling.       Left elbow: Normal.       Left wrist: He exhibits decreased range of motion (with extension), tenderness and swelling (mild).       Cervical back: He exhibits spasm (mild, noted in trapezious muscle bilaterally).       Left upper arm: Normal.       Left forearm: He exhibits bony tenderness (at most distal aspect of forearm ). He exhibits no  swelling.  Neurological: He is alert and oriented to person, place, and time. He has normal strength. No cranial nerve deficit or sensory deficit. He displays a negative Romberg sign. Gait normal.  Reflex Scores:      Tricep reflexes are 2+ on the right side and 2+ on the left side.      Bicep reflexes are 2+ on the right side and 2+ on the left side.      Brachioradialis reflexes are 2+ on the right side and 2+ on the left side.      Patellar reflexes are 2+ on the right side and 2+ on the left side.      Achilles reflexes are 2+ on the right side and 2+ on the left side. Normal FNF, heel to shin, and tandem walk.    Skin: Skin is warm and dry.     Psychiatric: He has a normal mood and affect.  Vitals reviewed.  No results found for this or any previous visit (from the past 24 hour(s)).  Assessment and Plan :  1. Left wrist pain Likely a sprain. Will treat conservatively with P.R.I.C.E. Principles. Pt should remain out of work until follow up appointment on Monday, 10/14/16. If wrist pain still persists after 5-7 days, consider repeat image.  - naproxen (NAPROSYN) 500 MG tablet; Take 1 tablet (500 mg total) by mouth 2 (two) times  daily with a meal.  Dispense: 30 tablet; Refill: 0 - Splint wrist 2. Muscle spasm - cyclobenzaprine (FLEXERIL) 5 MG tablet; Take 1 tablet (5 mg total) by mouth 3 (three) times daily as needed for muscle spasms.  Dispense: 60 tablet; Refill: 0 3. Post concussion syndrome No acute findings on neuro exam. Encouraged complete brain rest over the weekend. Given strict return/ED precautions.  4. Fall with injury, subsequent encounter  Benjiman Core, PA-C  [facility] 10/11/2016 3:13 PM

## 2016-10-11 NOTE — Patient Instructions (Addendum)
For wrist pain, I recommend wearing wrist splint. Start taking naproxen twice daily for pain and swelling. Apply ice to the affected area 4-5 x a day. If your pain still persists in 5-7 days, we may need a repeat image. For muscles tightness, take flexeril up to three times a day as needed. This can cause you to be drowsy so do not take it if you need to drive. For the headache, continue resting your brain over the weekend. Plan to return for follow up on Monday in office. If any symptoms worsen or you develop new concerning symptoms, seek care sooner. Thank you for letting me participate in your health and well being.   Wrist Sprain, Adult A wrist sprain is a stretch or tear in the strong, fibrous tissues (ligaments) that connect your wrist bones. There are three types of wrist sprains:  Grade 1. In this type of sprain, the ligament is stretched more than normal.  Grade 2. In this type of sprain, the ligament is partially torn. You may be able to move your wrist, but not very much.  Grade 3. In this type of sprain, the ligament or muscle is completely torn. You may find it difficult or extremely painful to move your wrist even a little.  What are the causes? A wrist sprain can be caused by using the wrist too much during sports, exercise, or at work. It can also happen with a fall or during an accident. What increases the risk? This condition is more likely to occur in people:  With a previous wrist or arm injury.  With poor wrist strength and flexibility.  Who play contact sports, such as football or soccer.  Who play sports that may result in a fall, such as skateboarding, biking, skiing, or snowboarding.  Who do not exercise regularly.  Who use exercise equipment that does not fit well.  What are the signs or symptoms? Symptoms of this condition include:  Pain in the wrist, arm, or hand.  Swelling or bruised skin near the wrist, hand, or arm. The skin may look yellow or kind of  blue.  Stiffness or trouble moving the hand.  Hearing a pop or feeling a tear at the time of the injury.  A warm feeling in the skin around the wrist.  How is this diagnosed? This condition is diagnosed with a physical exam. Sometimes an X-ray is taken to make sure a bone did not break. If your health care provider thinks that you tore a ligament, he or she may order an MRI of your wrist. How is this treated? This condition is treated by resting and applying ice to your wrist. Additional treatment may include:  Medicine for pain and inflammation.  A splint to keep your wrist still (immobilized).  Exercises to strengthen and stretch your wrist.  Surgery. This may be done if the ligament is completely torn.  Follow these instructions at home: If you have a splint:   Do not put pressure on any part of the splint until it is fully hardened. This may take several hours.  Wear the splint as told by your health care provider. Remove it only as told by your health care provider.  Loosen the splint if your fingers tingle, become numb, or turn cold and blue.  If your splint is not waterproof: ? Do not let it get wet. ? Cover it with a watertight covering when you take a bath or a shower.  Keep the splint clean. Managing  pain, stiffness, and swelling   If directed, put ice on the injured area. ? If you have a removable splint, remove it as told by your health care provider. ? Put ice in a plastic bag. ? Place a towel between your skin and the bag or between the splint and the bag. ? Leave the ice on for 20 minutes, 2-3 times per day.  Move your fingers often to avoid stiffness and to lessen swelling.  Raise (elevate) the injured area above the level of your heart while you are sitting or lying down. Activity  Rest your wrist. Do not do things that cause pain.  Return to your normal activities as told by your health care provider. Ask your health care provider what activities  are safe for you.  Do exercises as told by your health care provider. General instructions  Take over-the-counter and prescription medicines only as told by your health care provider.  Do not use any products that contain nicotine or tobacco, such as cigarettes and e-cigarettes. These can delay healing. If you need help quitting, ask your health care provider.  Ask your health care provider when it is safe to drive if you have a splint.  Keep all follow-up visits as told by your health care provider. This is important. Contact a health care provider if:  Your pain, bruising, or swelling gets worse.  Your skin becomes red, gets a rash, or has open sores.  Your pain does not get better or it gets worse. Get help right away if:  You have a new or sudden sharp pain in the hand, arm, or wrist.  You have tingling or numbness in your hand.  Your fingers turn white, very red, or cold and blue.  You cannot move your fingers. This information is not intended to replace advice given to you by your health care provider. Make sure you discuss any questions you have with your health care provider. Document Released: 11/19/2013 Document Revised: 10/14/2015 Document Reviewed: 10/05/2015 Elsevier Interactive Patient Education  2017 Elsevier Inc.   Post-Concussion Syndrome Post-concussion syndrome is the symptoms that can occur after a head injury. These symptoms can last from weeks to months. Follow these instructions at home:  Take medicines only as told by your doctor.  Do not take aspirin.  Sleep with your head raised to help with headaches.  Avoid activities that can cause another head injury. ? Do not play contact sports like football, hockey, soccer, or basketball. ? Do not do other risky activities like downhill skiing, martial arts, or horseback riding until your doctor says it is okay.  Keep all follow-up visits as told by your doctor. This is important. Contact a doctor  if:  You have a harder time: ? Paying attention. ? Focusing. ? Remembering. ? Learning new information. ? Dealing with stress.  You need more time to complete tasks.  You are easily bothered (irritable).  You have more symptoms. Get help if you have any of these symptoms for more than two weeks after your injury:  Long-lasting (chronic) headaches.  Dizziness.  Trouble balancing.  Feeling sick to your stomach (nauseous).  Trouble with your vision.  Noise or light bothers you more.  Depression.  Mood swings.  Feeling worried (anxious).  Easily bothered.  Memory problems.  Trouble concentrating or paying attention.  Sleep problems.  Feeling tired all of the time.  Get help right away if:  You feel confused.  You feel very sleepy.  You are hard  to wake up.  You feel sick to your stomach.  You keep throwing up (vomiting).  You feel like you are moving when you are not (vertigo).  Your eyes move back and forth very quickly.  You start shaking (convulsing) or pass out (faint).  You have very bad headaches that do not get better with medicine.  You cannot use your arms or legs like normal.  One of the black centers of your eyes (pupils) is bigger than the other.  You have clear or bloody fluid coming from your nose or ears.  Your problems get worse, not better. This information is not intended to replace advice given to you by your health care provider. Make sure you discuss any questions you have with your health care provider. Document Released: 04/25/2004 Document Revised: 08/24/2015 Document Reviewed: 06/23/2013 Elsevier Interactive Patient Education  2018 [employer]   IF you received an x-ray today, you will receive an invoice from [facility]. Please contact [facility] at [phone] with questions or concerns regarding your invoice.   IF you received labwork today, you will receive an invoice from [facility]. Please  contact [facility] at [phone] with questions or concerns regarding your invoice.   Our billing staff will not be able to assist you with questions regarding bills from these companies.  You will be contacted with the lab results as soon as they are available. The fastest way to get your results is to activate your My Chart account. Instructions are located on the last page of this paperwork. If you have not heard from us regarding the results in 2 weeks, please contact this office.

## 2016-10-14 ENCOUNTER — Ambulatory Visit (INDEPENDENT_AMBULATORY_CARE_PROVIDER_SITE_OTHER): Payer: Worker's Compensation | Admitting: Physician Assistant

## 2016-10-14 ENCOUNTER — Encounter: Payer: Self-pay | Admitting: Physician Assistant

## 2016-10-14 ENCOUNTER — Ambulatory Visit (INDEPENDENT_AMBULATORY_CARE_PROVIDER_SITE_OTHER): Payer: Worker's Compensation

## 2016-10-14 VITALS — BP 109/64 | HR 87 | Temp 98.4°F | Resp 17 | Ht 66.25 in | Wt 220.6 lb

## 2016-10-14 DIAGNOSIS — S63502D Unspecified sprain of left wrist, subsequent encounter: Secondary | ICD-10-CM

## 2016-10-14 DIAGNOSIS — M25531 Pain in right wrist: Secondary | ICD-10-CM | POA: Diagnosis not present

## 2016-10-14 DIAGNOSIS — M25532 Pain in left wrist: Secondary | ICD-10-CM | POA: Diagnosis not present

## 2016-10-14 DIAGNOSIS — S6991XA Unspecified injury of right wrist, hand and finger(s), initial encounter: Secondary | ICD-10-CM | POA: Diagnosis not present

## 2016-10-14 MED ORDER — NAPROXEN 500 MG PO TABS: 500.0000 mg | ORAL_TABLET | Freq: Two times a day (BID) | ORAL | 0 refills | Status: DC

## 2016-10-14 NOTE — Patient Instructions (Signed)
     IF you received an x-ray today, you will receive an invoice from [facility]. Please contact [facility] at [phone] with questions or concerns regarding your invoice.   IF you received labwork today, you will receive an invoice from [facility]. Please contact [facility] at [phone] with questions or concerns regarding your invoice.   Our billing staff will not be able to assist you with questions regarding bills from these companies.  You will be contacted with the lab results as soon as they are available. The fastest way to get your results is to activate your My Chart account. Instructions are located on the last page of this paperwork. If you have not heard from us regarding the results in 2 weeks, please contact this office.     

## 2016-10-14 NOTE — Progress Notes (Signed)
Jack Jensen 10/10/1990 26 y.o.   Chief Complaint  Patient presents with  . Follow-up    left wrist pain, continued weakness and pain with certain movements and angles     Date of Injury: 10/09/16  History of Present Illness:  Presents for evaluation of work-related complaint following a fall from scaffold at work on 10/09/2016, which was "somewhere between 8-10 ft off the ground". Plain films of cervical spine, left wrist, forearm, elbow, and shoulder were negative for acute fracture or dislocation. CT head negative for acute findings  Last OV 7/13 c/o continuing wrist pain. Last OV plan: "Likely a sprain. Will treat conservatively with P.R.I.C.E. Principles. Pt should remain out of work until follow up appointment on Monday, 10/14/16. If wrist pain still persists after 5-7 days, consider repeat image." Today c/o continued wrist pain, especially below his thumb. Worse with flexion and extension movements. He has been wearing his splint as directed and has decreased his physical activity. He is not taking pain medication - never went to his pharmacy. He still cannot tie his shoes. His pain is not worsening, "it's just not better". He reports about 40% better overall.   Denies increased swelling, joint redness/warmth, abnormal sensation. .    Review of Systems  Musculoskeletal: Positive for joint pain (right wrist. ).  Neurological: Negative for sensory change.     Allergies  Allergen Reactions  . Cephalexin Hives    Current medications reviewed and updated. Past medical history, family history, social history have been reviewed and updated.   Physical Exam  Constitutional: He is oriented to person, place, and time and well-developed, well-nourished, and in no distress. No distress.  Cardiovascular: Normal rate, regular rhythm and normal heart sounds.   Musculoskeletal:       Right wrist: He exhibits decreased range of motion and bony tenderness (distal radius). He exhibits no  swelling, no crepitus and no deformity.  Neurological: He is alert and oriented to person, place, and time. He has normal sensation. GCS score is 15.  Skin: Skin is warm and dry.  Psychiatric: Mood, memory, affect and judgment normal.  Vitals reviewed.   Dg Wrist Complete Right  Result Date: 10/14/2016 CLINICAL DATA:  26 y/o M; wrist pain. Fall 1 week ago. Rule out scaphoid fracture. EXAM: RIGHT WRIST - COMPLETE 3+ VIEW COMPARISON:  None. FINDINGS: There is no evidence of fracture or dislocation. There is no evidence of arthropathy or other focal bone abnormality. Soft tissues are unremarkable. IMPRESSION: No acute fracture identified. Electronically Signed   By: Jack HansenLance  Jensen M.D.   On: 10/14/2016 14:11    Assessment and Plan: 1. Sprain of left wrist, subsequent encounter 2. Left wrist pain - DG Wrist Complete Right; Future - naproxen (NAPROSYN) 500 MG tablet; Take 1 tablet (500 mg total) by mouth 2 (two) times daily with a meal.  Dispense: 30 tablet; Refill: 0 - This is Mr. Jack Jensen's 3rd appt following fall from scaffold at work. Pain of left wrist has not resolved. Repeat x-ray is negative. Con't PRICE principles and wrist brace. Will write letter to remain out of work x 5 days. Pt has vacation coming up - advised to make f/u appt upon return, Aug 2. Consider ortho referral if no improvement. He understands and agrees.    Jack CollieWhitney Roxy Mastandrea, PA-C  [facility] at [facility] 10/14/2016 1:55 PM

## 2016-10-30 ENCOUNTER — Encounter: Payer: Self-pay | Admitting: Family Medicine

## 2016-10-30 ENCOUNTER — Ambulatory Visit (INDEPENDENT_AMBULATORY_CARE_PROVIDER_SITE_OTHER): Payer: Worker's Compensation | Admitting: Family Medicine

## 2016-10-30 VITALS — BP 110/62 | HR 84 | Temp 98.7°F | Resp 17 | Ht 65.0 in | Wt 224.0 lb

## 2016-10-30 DIAGNOSIS — S6392XD Sprain of unspecified part of left wrist and hand, subsequent encounter: Secondary | ICD-10-CM | POA: Diagnosis not present

## 2016-10-30 DIAGNOSIS — Y990 Civilian activity done for income or pay: Secondary | ICD-10-CM

## 2016-10-30 DIAGNOSIS — M79642 Pain in left hand: Secondary | ICD-10-CM

## 2016-10-30 NOTE — Progress Notes (Signed)
Patient ID: Jack Jensen, male    DOB: 02/17/1991  Age: 26 y.o. MRN: [id number]  Chief Complaint  Patient presents with  . Follow-up    WC wrist injury     Subjective:   26 year old male who had a fall on July 11 and injured his left hand. He is been seen a couple times for it, last visit being 2 weeks ago. He has been wearing a wrist splint faithfully. Last time he reported his pain had improved by about 40%, and now he moves that arm up to about 60%. However he continues to have pain in the hand. When the hyper flexes the hand he notes pain across the base of the hand, and when he hyperextends the wrist there is pain on the dorsum of the wrist. He has good function of his fingers. Ever had an injury like this.  Current allergies, medications, problem list, past/family and social histories reviewed.  Objective:  BP 110/62   Pulse 84   Temp 98.7 F (37.1 C) (Oral)   Resp 17   Ht 5\' 5"  (1.651 m)   Wt 224 lb (101.6 kg)   SpO2 98%   BMI 37.28 kg/m   No major distress. No gross anomalies could be seen. He has good range of motion of his hand. On flexion there is pain along the base of the hand in the lunate and capitate regions of the wrist/hand. He is tender in that area. On extension there is pain right at the wrist joint line and/proximal carpal/metacarpal region. Grip is fairly good, slightly weaker than the right. Thumb and fifth finger apposition strength is good. X-rays were reviewed and no bony injuries can be noted in the carpal bones.  Assessment & Plan:   Assessment: 1. Hand pain, left   2. Hand sprain, left, subsequent encounter   3. Work related injury       Plan: We'll make referral to orthopedic hand specialist because of persisting atypical pain along the carpal bones. He is improving, and hopefully this is just a sprain, but they can decide whether further imaging such as MRI is indicated. Allowed to go back to work with limited use of the left hand.  Orders Placed  This Encounter  Procedures  . Ambulatory referral to Hand Surgery    Referral Priority:   Routine    Referral Type:   Surgical    Referral Reason:   Specialty Services Required    Requested Specialty:   Hand Surgery    Number of Visits Requested:   1    No orders of the defined types were placed in this encounter.        Patient Instructions   May return to work light duty limiting use of left hand and wearing splint  Referral has been made to orthopedic hand specialist  Continue wearing the splint most of the time, but take it off a half dozen times of the day and exercise the wrist a little bit with range of motion.  The orthopedic doctor will follow up from this point until fully released for regular duty. Return here only if needed.    IF you received an x-ray today, you will receive an invoice from [facility]. Please contact [facility] at [phone] with questions or concerns regarding your invoice.   IF you received labwork today, you will receive an invoice from [facility]. Please contact [facility] at [phone] with questions or concerns regarding your invoice.   Our billing staff  will not be able to assist you with questions regarding bills from these companies.  You will be contacted with the lab results as soon as they are available. The fastest way to get your results is to activate your My Chart account. Instructions are located on the last page of this paperwork. If you have not heard from us regarding the results in 2 weeks, please contact this office.         Return if symptoms worsen or fail to improve.   HOPPER,DAVID, MD 10/30/2016

## 2016-10-30 NOTE — Patient Instructions (Addendum)
May return to work light duty limiting use of left hand and wearing splint  Referral has been made to orthopedic hand specialist  Continue wearing the splint most of the time, but take it off a half dozen times of the day and exercise the wrist a little bit with range of motion.  The orthopedic doctor will follow up from this point until fully released for regular duty. Return here only if needed.    IF you received an x-ray today, you will receive an invoice from [facility]. Please contact [facility] at [phone] with questions or concerns regarding your invoice.   IF you received labwork today, you will receive an invoice from [facility]. Please contact [facility] at [phone] with questions or concerns regarding your invoice.   Our billing staff will not be able to assist you with questions regarding bills from these companies.  You will be contacted with the lab results as soon as they are available. The fastest way to get your results is to activate your My Chart account. Instructions are located on the last page of this paperwork. If you have not heard from us regarding the results in 2 weeks, please contact this office.

## 2016-11-02 ENCOUNTER — Ambulatory Visit (INDEPENDENT_AMBULATORY_CARE_PROVIDER_SITE_OTHER): Payer: BLUE CROSS/BLUE SHIELD | Admitting: Emergency Medicine

## 2016-11-02 ENCOUNTER — Encounter: Payer: Self-pay | Admitting: Emergency Medicine

## 2016-11-02 VITALS — BP 115/77 | HR 60 | Temp 98.0°F | Resp 18 | Ht 66.93 in | Wt 222.0 lb

## 2016-11-02 DIAGNOSIS — Z111 Encounter for screening for respiratory tuberculosis: Secondary | ICD-10-CM | POA: Diagnosis not present

## 2016-11-02 DIAGNOSIS — Z0000 Encounter for general adult medical examination without abnormal findings: Secondary | ICD-10-CM

## 2016-11-02 NOTE — Progress Notes (Signed)

## 2016-11-02 NOTE — Patient Instructions (Addendum)
   IF you received an x-ray today, you will receive an invoice from [facility]. Please contact [facility] at [phone] with questions or concerns regarding your invoice.   IF you received labwork today, you will receive an invoice from [facility]. Please contact [facility] at [phone] with questions or concerns regarding your invoice.   Our billing staff will not be able to assist you with questions regarding bills from these companies.  You will be contacted with the lab results as soon as they are available. The fastest way to get your results is to activate your My Chart account. Instructions are located on the last page of this paperwork. If you have not heard from us regarding the results in 2 weeks, please contact this office.      Health Maintenance, Male A healthy lifestyle and preventive care is important for your health and wellness. Ask your health care provider about what schedule of regular examinations is right for you. What should I know about weight and diet? Eat a Healthy Diet  Eat plenty of vegetables, fruits, whole grains, low-fat dairy products, and lean protein.  Do not eat a lot of foods high in solid fats, added sugars, or salt.  Maintain a Healthy Weight Regular exercise can help you achieve or maintain a healthy weight. You should:  Do at least 150 minutes of exercise each week. The exercise should increase your heart rate and make you sweat (moderate-intensity exercise).  Do strength-training exercises at least twice a week.  Watch Your Levels of Cholesterol and Blood Lipids  Have your blood tested for lipids and cholesterol every 5 years starting at 25 years of age. If you are at high risk for heart disease, you should start having your blood tested when you are 25 years old. You may need to have your cholesterol levels checked more often if: ? Your lipid or cholesterol levels are high. ? You are older than 25 years of age. ? You  are at high risk for heart disease.  What should I know about cancer screening? Many types of cancers can be detected early and may often be prevented. Lung Cancer  You should be screened every year for lung cancer if: ? You are a current smoker who has smoked for at least 30 years. ? You are a former smoker who has quit within the past 15 years.  Talk to your health care provider about your screening options, when you should start screening, and how often you should be screened.  Colorectal Cancer  Routine colorectal cancer screening usually begins at 25 years of age and should be repeated every 5-10 years until you are 25 years old. You may need to be screened more often if early forms of precancerous polyps or small growths are found. Your health care provider may recommend screening at an earlier age if you have risk factors for colon cancer.  Your health care provider may recommend using home test kits to check for hidden blood in the stool.  A small camera at the end of a tube can be used to examine your colon (sigmoidoscopy or colonoscopy). This checks for the earliest forms of colorectal cancer.  Prostate and Testicular Cancer  Depending on your age and overall health, your health care provider may do certain tests to screen for prostate and testicular cancer.  Talk to your health care provider about any symptoms or concerns you have about testicular or prostate cancer.  Skin Cancer  Check your skin   from head to toe regularly.  Tell your health care provider about any new moles or changes in moles, especially if: ? There is a change in a mole's size, shape, or color. ? You have a mole that is larger than a pencil eraser.  Always use sunscreen. Apply sunscreen liberally and repeat throughout the day.  Protect yourself by wearing long sleeves, pants, a wide-brimmed hat, and sunglasses when outside.  What should I know about heart disease, diabetes, and high blood  pressure?  If you are 18-39 years of age, have your blood pressure checked every 3-5 years. If you are 40 years of age or older, have your blood pressure checked every year. You should have your blood pressure measured twice-once when you are at a hospital or clinic, and once when you are not at a hospital or clinic. Record the average of the two measurements. To check your blood pressure when you are not at a hospital or clinic, you can use: ? An automated blood pressure machine at a pharmacy. ? A home blood pressure monitor.  Talk to your health care provider about your target blood pressure.  If you are between 45-79 years old, ask your health care provider if you should take aspirin to prevent heart disease.  Have regular diabetes screenings by checking your fasting blood sugar level. ? If you are at a normal weight and have a low risk for diabetes, have this test once every three years after the age of 45. ? If you are overweight and have a high risk for diabetes, consider being tested at a younger age or more often.  A one-time screening for abdominal aortic aneurysm (AAA) by ultrasound is recommended for men aged 65-75 years who are current or former smokers. What should I know about preventing infection? Hepatitis B If you have a higher risk for hepatitis B, you should be screened for this virus. Talk with your health care provider to find out if you are at risk for hepatitis B infection. Hepatitis C Blood testing is recommended for:  Everyone born from 1945 through 1965.  Anyone with known risk factors for hepatitis C.  Sexually Transmitted Diseases (STDs)  You should be screened each year for STDs including gonorrhea and chlamydia if: ? You are sexually active and are younger than 24 years of age. ? You are older than 24 years of age and your health care provider tells you that you are at risk for this type of infection. ? Your sexual activity has changed since you were last  screened and you are at an increased risk for chlamydia or gonorrhea. Ask your health care provider if you are at risk.  Talk with your health care provider about whether you are at high risk of being infected with HIV. Your health care provider may recommend a prescription medicine to help prevent HIV infection.  What else can I do?  Schedule regular health, dental, and eye exams.  Stay current with your vaccines (immunizations).  Do not use any tobacco products, such as cigarettes, chewing tobacco, and e-cigarettes. If you need help quitting, ask your health care provider.  Limit alcohol intake to no more than 2 drinks per day. One drink equals 12 ounces of beer, 5 ounces of wine, or 1 ounces of hard liquor.  Do not use street drugs.  Do not share needles.  Ask your health care provider for help if you need support or information about quitting drugs.  Tell your health care   provider if you often feel depressed.  Tell your health care provider if you have ever been abused or do not feel safe at home. This information is not intended to replace advice given to you by your health care provider. Make sure you discuss any questions you have with your health care provider. Document Released: 09/14/2007 Document Revised: 11/15/2015 Document Reviewed: 12/20/2014 Elsevier Interactive Patient Education  2018 [employer]  American Heart Association (AHA) Exercise Recommendation  Being physically active is important to prevent heart disease and stroke, the nation's No. 1and No. 5killers. To improve overall cardiovascular health, we suggest at least 150 minutes per week of moderate exercise or 75 minutes per week of vigorous exercise (or a combination of moderate and vigorous activity). Thirty minutes a day, five times a week is an easy goal to remember. You will also experience benefits even if you divide your time into two or three segments of 10 to 15 minutes per day.  For people who would  benefit from lowering their blood pressure or cholesterol, we recommend 40 minutes of aerobic exercise of moderate to vigorous intensity three to four times a week to lower the risk for heart attack and stroke.  Physical activity is anything that makes you move your body and burn calories.  This includes things like climbing stairs or playing sports. Aerobic exercises benefit your heart, and include walking, jogging, swimming or biking. Strength and stretching exercises are best for overall stamina and flexibility.  The simplest, positive change you can make to effectively improve your heart health is to start walking. It's enjoyable, free, easy, social and great exercise. A walking program is flexible and boasts high success rates because people can stick with it. It's easy for walking to become a regular and satisfying part of life.   For Overall Cardiovascular Health:  At least 30 minutes of moderate-intensity aerobic activity at least 5 days per week for a total of 150  OR   At least 25 minutes of vigorous aerobic activity at least 3 days per week for a total of 75 minutes; or a combination of moderate- and vigorous-intensity aerobic activity  AND   Moderate- to high-intensity muscle-strengthening activity at least 2 days per week for additional health benefits.  For Lowering Blood Pressure and Cholesterol  An average 40 minutes of moderate- to vigorous-intensity aerobic activity 3 or 4 times per week  What if I can't make it to the time goal? Something is always better than nothing! And everyone has to start somewhere. Even if you've been sedentary for years, today is the day you can begin to make healthy changes in your life. If you don't think you'll make it for 30 or 40 minutes, set a reachable goal for today. You can work up toward your overall goal by increasing your time as you get stronger. Don't let all-or-nothing thinking rob you of doing what you can every day.   Source:http://www.heart.org    

## 2016-11-02 NOTE — Progress Notes (Signed)
Jack Jensen 26 y.o.   Chief Complaint  Patient presents with  . Annual Exam    health physical for work     HISTORY OF PRESENT ILLNESS: This is a 26 y.o. male needs health evaluation for work purposes. Has no complaints or medical concerns.  HPI   Prior to Admission medications   Not on File    Allergies  Allergen Reactions  . Cephalexin Hives    Patient Active Problem List   Diagnosis Date Noted  . Bilateral inguinal hernia 02/24/2012    Past Medical History:  Diagnosis Date  . Asthma   . No pertinent past medical history     Past Surgical History:  Procedure Laterality Date  . HERNIA REPAIR    . HERNIA REPAIR    . INGUINAL HERNIA REPAIR  03/16/2012   Procedure: LAPAROSCOPIC BILATERAL INGUINAL HERNIA REPAIR;  Surgeon: Shelly Rubensteinouglas A Blackman, MD;  Location: [facility];  Service: General;  Laterality: Bilateral;  . INSERTION OF MESH  03/16/2012   Procedure: INSERTION OF MESH;  Surgeon: Shelly Rubensteinouglas A Blackman, MD;  Location: [facility];  Service: General;  Laterality: Bilateral;  . NO PAST SURGERIES      Social History   Social History  . Marital status: Single    Spouse name: N/A  . Number of children: N/A  . Years of education: N/A   Occupational History  . Not on file.   Social History Main Topics  . Smoking status: Never Smoker  . Smokeless tobacco: Never Used  . Alcohol use Yes     Comment: socially  . Drug use: No  . Sexual activity: Not on file   Other Topics Concern  . Not on file   Social History Narrative  . No narrative on file    History reviewed. No pertinent family history.   Review of Systems  Constitutional: Negative.   HENT: Negative.   Eyes: Negative.   Respiratory: Negative.   Cardiovascular: Negative.   Gastrointestinal: Negative.   Genitourinary: Negative.   Musculoskeletal: Negative.   Skin: Negative.   Endo/Heme/Allergies: Negative.   All other systems reviewed and are  negative.   Vitals:   11/02/16 0827  BP: 115/77  Pulse: 60  Resp: 18  Temp: 98 F (36.7 C)    Physical Exam  Constitutional: He is oriented to person, place, and time. He appears well-developed and well-nourished.  HENT:  Head: Normocephalic and atraumatic.  Nose: Nose normal.  Mouth/Throat: Oropharynx is clear and moist.  Eyes: Pupils are equal, round, and reactive to light. Conjunctivae and EOM are normal.  Neck: Normal range of motion. Neck supple. No JVD present.  Cardiovascular: Normal rate, regular rhythm, normal heart sounds and intact distal pulses.   Pulmonary/Chest: Effort normal and breath sounds normal.  Abdominal: Soft. Bowel sounds are normal. He exhibits no distension. There is no tenderness.  Musculoskeletal: Normal range of motion.  Lymphadenopathy:    He has no cervical adenopathy.  Neurological: He is alert and oriented to person, place, and time. No sensory deficit. He exhibits normal muscle tone.  Skin: Skin is warm and dry. Capillary refill takes less than 2 seconds. No rash noted.  Psychiatric: He has a normal mood and affect. His behavior is normal.  Vitals reviewed.    ASSESSMENT & PLAN: Jack Jensen was seen today for annual exam.  Diagnoses and all orders for this visit:  Routine general medical examination at a health care facility  PPD screening test -  TB Skin Test    Patient Instructions       IF you received an x-ray today, you will receive an invoice from [facility]. Please contact [facility] at [phone] with questions or concerns regarding your invoice.   IF you received labwork today, you will receive an invoice from [facility]. Please contact [facility] at [phone] with questions or concerns regarding your invoice.   Our billing staff will not be able to assist you with questions regarding bills from these companies.  You will be contacted with the lab results as soon as they are available. The fastest  way to get your results is to activate your My Chart account. Instructions are located on the last page of this paperwork. If you have not heard from us regarding the results in 2 weeks, please contact this office.         Health Maintenance, Male A healthy lifestyle and preventive care is important for your health and wellness. Ask your health care provider about what schedule of regular examinations is right for you. What should I know about weight and diet? Eat a Healthy Diet  Eat plenty of vegetables, fruits, whole grains, low-fat dairy products, and lean protein.  Do not eat a lot of foods high in solid fats, added sugars, or salt.  Maintain a Healthy Weight Regular exercise can help you achieve or maintain a healthy weight. You should:  Do at least 150 minutes of exercise each week. The exercise should increase your heart rate and make you sweat (moderate-intensity exercise).  Do strength-training exercises at least twice a week.  Watch Your Levels of Cholesterol and Blood Lipids  Have your blood tested for lipids and cholesterol every 5 years starting at 25 years of age. If you are at high risk for heart disease, you should start having your blood tested when you are 25 years old. You may need to have your cholesterol levels checked more often if: ? Your lipid or cholesterol levels are high. ? You are older than 25 years of age. ? You are at high risk for heart disease.  What should I know about cancer screening? Many types of cancers can be detected early and may often be prevented. Lung Cancer  You should be screened every year for lung cancer if: ? You are a current smoker who has smoked for at least 30 years. ? You are a former smoker who has quit within the past 15 years.  Talk to your health care provider about your screening options, when you should start screening, and how often you should be screened.  Colorectal Cancer  Routine colorectal cancer screening  usually begins at 25 years of age and should be repeated every 5-10 years until you are 25 years old. You may need to be screened more often if early forms of precancerous polyps or small growths are found. Your health care provider may recommend screening at an earlier age if you have risk factors for colon cancer.  Your health care provider may recommend using home test kits to check for hidden blood in the stool.  A small camera at the end of a tube can be used to examine your colon (sigmoidoscopy or colonoscopy). This checks for the earliest forms of colorectal cancer.  Prostate and Testicular Cancer  Depending on your age and overall health, your health care provider may do certain tests to screen for prostate and testicular cancer.  Talk to your health care provider about any symptoms or  concerns you have about testicular or prostate cancer.  Skin Cancer  Check your skin from head to toe regularly.  Tell your health care provider about any new moles or changes in moles, especially if: ? There is a change in a mole's size, shape, or color. ? You have a mole that is larger than a pencil eraser.  Always use sunscreen. Apply sunscreen liberally and repeat throughout the day.  Protect yourself by wearing long sleeves, pants, a wide-brimmed hat, and sunglasses when outside.  What should I know about heart disease, diabetes, and high blood pressure?  If you are 72-75 years of age, have your blood pressure checked every 3-5 years. If you are 80 years of age or older, have your blood pressure checked every year. You should have your blood pressure measured twice-once when you are at a hospital or clinic, and once when you are not at a hospital or clinic. Record the average of the two measurements. To check your blood pressure when you are not at a hospital or clinic, you can use: ? An automated blood pressure machine at a pharmacy. ? A home blood pressure monitor.  Talk to your health care  provider about your target blood pressure.  If you are between 56-42 years old, ask your health care provider if you should take aspirin to prevent heart disease.  Have regular diabetes screenings by checking your fasting blood sugar level. ? If you are at a normal weight and have a low risk for diabetes, have this test once every three years after the age of 26. ? If you are overweight and have a high risk for diabetes, consider being tested at a younger age or more often.  A one-time screening for abdominal aortic aneurysm (AAA) by ultrasound is recommended for men aged 65-75 years who are current or former smokers. What should I know about preventing infection? Hepatitis B If you have a higher risk for hepatitis B, you should be screened for this virus. Talk with your health care provider to find out if you are at risk for hepatitis B infection. Hepatitis C Blood testing is recommended for:  Everyone born from [birth year] through 1964.  Anyone with known risk factors for hepatitis C.  Sexually Transmitted Diseases (STDs)  You should be screened each year for STDs including gonorrhea and chlamydia if: ? You are sexually active and are younger than 25 years of age. ? You are older than 25 years of age and your health care provider tells you that you are at risk for this type of infection. ? Your sexual activity has changed since you were last screened and you are at an increased risk for chlamydia or gonorrhea. Ask your health care provider if you are at risk.  Talk with your health care provider about whether you are at high risk of being infected with HIV. Your health care provider may recommend a prescription medicine to help prevent HIV infection.  What else can I do?  Schedule regular health, dental, and eye exams.  Stay current with your vaccines (immunizations).  Do not use any tobacco products, such as cigarettes, chewing tobacco, and e-cigarettes. If you need help quitting, ask  your health care provider.  Limit alcohol intake to no more than 2 drinks per day. One drink equals 12 ounces of beer, 5 ounces of wine, or 1 ounces of hard liquor.  Do not use street drugs.  Do not share needles.  Ask your health care provider for  help if you need support or information about quitting drugs.  Tell your health care provider if you often feel depressed.  Tell your health care provider if you have ever been abused or do not feel safe at home. This information is not intended to replace advice given to you by your health care provider. Make sure you discuss any questions you have with your health care provider. Document Released: 09/14/2007 Document Revised: 11/15/2015 Document Reviewed: 12/20/2014 Elsevier Interactive Patient Education  2018 [employer].  American Heart Association (AHA) Exercise Recommendation  Being physically active is important to prevent heart disease and stroke, the nation's No. 1and No. 5killers. To improve overall cardiovascular health, we suggest at least 150 minutes per week of moderate exercise or 75 minutes per week of vigorous exercise (or a combination of moderate and vigorous activity). Thirty minutes a day, five times a week is an easy goal to remember. You will also experience benefits even if you divide your time into two or three segments of 10 to 15 minutes per day.  For people who would benefit from lowering their blood pressure or cholesterol, we recommend 40 minutes of aerobic exercise of moderate to vigorous intensity three to four times a week to lower the risk for heart attack and stroke.  Physical activity is anything that makes you move your body and burn calories.  This includes things like climbing stairs or playing sports. Aerobic exercises benefit your heart, and include walking, jogging, swimming or biking. Strength and stretching exercises are best for overall stamina and flexibility.  The simplest, positive change you  can make to effectively improve your heart health is to start walking. It's enjoyable, free, easy, social and great exercise. A walking program is flexible and boasts high success rates because people can stick with it. It's easy for walking to become a regular and satisfying part of life.   For Overall Cardiovascular Health:  At least 30 minutes of moderate-intensity aerobic activity at least 5 days per week for a total of 150  OR   At least 25 minutes of vigorous aerobic activity at least 3 days per week for a total of 75 minutes; or a combination of moderate- and vigorous-intensity aerobic activity  AND   Moderate- to high-intensity muscle-strengthening activity at least 2 days per week for additional health benefits.  For Lowering Blood Pressure and Cholesterol  An average 40 minutes of moderate- to vigorous-intensity aerobic activity 3 or 4 times per week  What if I can't make it to the time goal? Something is always better than nothing! And everyone has to start somewhere. Even if you've been sedentary for years, today is the day you can begin to make healthy changes in your life. If you don't think you'll make it for 30 or 40 minutes, set a reachable goal for today. You can work up toward your overall goal by increasing your time as you get stronger. Don't let all-or-nothing thinking rob you of doing what you can every day.  Source:http://www.heart.org      Work form filled out.  Edwina Barth, MD [facility]

## 2016-11-04 ENCOUNTER — Ambulatory Visit (INDEPENDENT_AMBULATORY_CARE_PROVIDER_SITE_OTHER): Payer: BLUE CROSS/BLUE SHIELD | Admitting: Emergency Medicine

## 2016-11-04 DIAGNOSIS — Z111 Encounter for screening for respiratory tuberculosis: Secondary | ICD-10-CM

## 2016-11-04 LAB — TB SKIN TEST
Induration: 0 mm
TB Skin Test: NEGATIVE

## 2016-11-04 NOTE — Progress Notes (Signed)
Here for PPD reading; negative.

## 2016-12-04 ENCOUNTER — Telehealth: Payer: Self-pay | Admitting: Physician Assistant

## 2016-12-04 NOTE — Telephone Encounter (Signed)
Thanks

## 2016-12-04 NOTE — Telephone Encounter (Signed)
Called Jack GammonMarta Fitzpatrick the insurance adjustor for this pt's worker's comp case to get a prior auth for the orthopaedic referral placed before we send it. She called me back and said the pt had already been seen at an ortho office. I will close this referral. Thanks!

## 2017-01-15 DIAGNOSIS — Z23 Encounter for immunization: Secondary | ICD-10-CM | POA: Diagnosis not present

## 2017-03-18 DIAGNOSIS — F4323 Adjustment disorder with mixed anxiety and depressed mood: Secondary | ICD-10-CM | POA: Diagnosis not present

## 2017-03-18 DIAGNOSIS — F901 Attention-deficit hyperactivity disorder, predominantly hyperactive type: Secondary | ICD-10-CM | POA: Diagnosis not present

## 2017-04-03 DIAGNOSIS — F320 Major depressive disorder, single episode, mild: Secondary | ICD-10-CM | POA: Diagnosis not present

## 2017-04-03 DIAGNOSIS — F901 Attention-deficit hyperactivity disorder, predominantly hyperactive type: Secondary | ICD-10-CM | POA: Diagnosis not present

## 2017-04-03 DIAGNOSIS — F4323 Adjustment disorder with mixed anxiety and depressed mood: Secondary | ICD-10-CM | POA: Diagnosis not present

## 2017-07-01 ENCOUNTER — Other Ambulatory Visit: Payer: Self-pay

## 2017-07-01 ENCOUNTER — Ambulatory Visit: Payer: BC Managed Care – PPO | Admitting: Physician Assistant

## 2017-07-01 ENCOUNTER — Encounter: Payer: Self-pay | Admitting: Physician Assistant

## 2017-07-01 VITALS — BP 124/62 | HR 107 | Temp 98.9°F | Resp 18 | Ht 67.72 in | Wt 222.2 lb

## 2017-07-01 DIAGNOSIS — J069 Acute upper respiratory infection, unspecified: Secondary | ICD-10-CM

## 2017-07-01 DIAGNOSIS — J029 Acute pharyngitis, unspecified: Secondary | ICD-10-CM

## 2017-07-01 DIAGNOSIS — R6883 Chills (without fever): Secondary | ICD-10-CM

## 2017-07-01 LAB — POCT RAPID STREP A (OFFICE): Rapid Strep A Screen: NEGATIVE

## 2017-07-01 LAB — POCT INFLUENZA A/B
Influenza A, POC: NEGATIVE
Influenza B, POC: NEGATIVE

## 2017-07-01 MED ORDER — MUCINEX DM MAXIMUM STRENGTH 60-1200 MG PO TB12: 1.0000 | ORAL_TABLET | Freq: Two times a day (BID) | ORAL | 0 refills | Status: DC

## 2017-07-01 MED ORDER — AZELASTINE HCL 0.1 % NA SOLN: 2.0000 | Freq: Two times a day (BID) | NASAL | 0 refills | Status: DC

## 2017-07-01 MED ORDER — BENZONATATE 100 MG PO CAPS: 100.0000 mg | ORAL_CAPSULE | Freq: Three times a day (TID) | ORAL | 0 refills | Status: DC | PRN

## 2017-07-01 NOTE — Progress Notes (Signed)
MRN: [id number] DOB: [birth year]  Subjective:   Jack Jensen is a 27 y.o. male presenting for chief complaint of Sore Throat (X 1 day); Chills (X today); and Cough (X 3 days) .  Reports 1 day history of sinus pain, rhinorrhea, ear pain, sore throat, and cough. Has some chills and body aches. Has tried melatonin for help with sleep. Had some fever last night. 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 Denies sinus pain, difficulty swallowing, wheezing, shortness of breath and chest pain, nausea, vomiting, abdominal pain and diarrhea. Has had sick contact with people at his work. No history of seasonal allergies, no history of asthma. Patient has had flu shot this season. Denies smoking. Denies any other aggravating or relieving factors, no other questions or concerns.  ROS Per HPI   Objective:   Vitals: BP 124/62 (BP Location: Right Arm, Patient Position: Sitting, Cuff Size: Large)   Pulse (!) 107   Temp 98.9 F (37.2 C) (Oral)   Resp 18   Ht 5' 7.72" (1.72 m)   Wt 222 lb 3.2 oz (100.8 kg)   SpO2 97%   BMI 34.07 kg/m   Physical Exam  Constitutional: He is oriented to person, place, and time. He appears well-developed and well-nourished. He does not appear ill.  HENT:  Head: Normocephalic and atraumatic.  Right Ear: External ear and ear canal normal. Tympanic membrane is scarred.  Left Ear: External ear and ear canal normal. Tympanic membrane is scarred.  Nose: Mucosal edema (moderate bilaterally) present. Right sinus exhibits no maxillary sinus tenderness and no frontal sinus tenderness. Left sinus exhibits no maxillary sinus tenderness and no frontal sinus tenderness.  Mouth/Throat: Uvula is midline. Posterior oropharyngeal erythema present. No posterior oropharyngeal edema or tonsillar abscesses. Tonsils are 2+ on the right. Tonsils are 2+ on the left. No tonsillar exudate.  Eyes: Conjunctivae are normal.  Neck: Normal range of motion.  Cardiovascular:  Normal rate, regular rhythm and normal heart sounds.  Pulmonary/Chest: Effort normal and breath sounds normal. He has no decreased breath sounds. He has no wheezes. He has no rhonchi. He has no rales.  Lymphadenopathy:       Head (right side): No submental, no submandibular, no tonsillar, no preauricular, no posterior auricular and no occipital adenopathy present.       Head (left side): No submental, no submandibular, no tonsillar, no preauricular, no posterior auricular and no occipital adenopathy present.    He has no cervical adenopathy.       Right: No supraclavicular adenopathy present.       Left: No supraclavicular adenopathy present.  Neurological: He is alert and oriented to person, place, and time.  Skin: Skin is warm and dry.  Psychiatric: He has a normal mood and affect.  Vitals reviewed.   Results for orders placed or performed in visit on 07/01/17 (from the past 24 hour(s))  POCT rapid strep A     Status: None   Collection Time: 07/01/17  2:12 PM  Result Value Ref Range   Rapid Strep A Screen Negative Negative  POCT Influenza A/B     Status: None   Collection Time: 07/01/17  2:12 PM  Result Value Ref Range   Influenza A, POC Negative Negative   Influenza B, POC Negative Negative    Assessment and Plan :  1. Viral upper respiratory tract infection Physical exam and point of care lab findings reassuring. This is consistent with a viral etiology, will treat  symptomatically. Patient instructed to return to clinic if symptoms worsen, do not improve in 5-7 days days, or as needed. - benzonatate (TESSALON) 100 MG capsule; Take 1-2 capsules (100-200 mg total) by mouth 3 (three) times daily as needed for cough.  Dispense: 40 capsule; Refill: 0 - Dextromethorphan-guaiFENesin (MUCINEX DM MAXIMUM STRENGTH) 60-1200 MG TB12; Take 1 tablet by mouth every 12 (twelve) hours.  Dispense: 20 each; Refill: 0 - azelastine (ASTELIN) 0.1 % nasal spray; Place 2 sprays into both nostrils 2 (two)  times daily. Use in each nostril as directed  Dispense: 30 mL; Refill: 0  2. Chills - POCT Influenza A/B  3. Sore throat Cx pending.  - POCT rapid strep A - Culture, Group A Strep  Benjiman CoreBrittany Wiseman, PA-C  [facility] at [facility] 07/01/2017 2:14 PM

## 2017-07-01 NOTE — Patient Instructions (Addendum)
- We will treat this as a respiratory viral infection while awaiting your strep culture results. - I recommend you rest, drink plenty of fluids, eat light meals including soups.  - You may use mucinex-dm if you want to help bring things up with your cough and Tessalon pearls if you want to stop during the day. - You may use nasal spray for congestion.  - You may also use Tylenol or ibuprofen over-the-counter for your sore throat. Tea recipe for sore throat: boil water, add 2 inches shaved ginger root, steep 15 minutes, add juice from 2 full lemons, and 2 tbsp honey. - Please let me know if you are not seeing any improvement or get worse in 5-7 days.   Upper Respiratory Infection, Adult Most upper respiratory infections (URIs) are caused by a virus. A URI affects the nose, throat, and upper air passages. The most common type of URI is often called "the common cold." Follow these instructions at home:  Take medicines only as told by your doctor.  Gargle warm saltwater or take cough drops to comfort your throat as told by your doctor.  Use a warm mist humidifier or inhale steam from a shower to increase air moisture. This may make it easier to breathe.  Drink enough fluid to keep your pee (urine) clear or pale yellow.  Eat soups and other clear broths.  Have a healthy diet.  Rest as needed.  Go back to work when your fever is gone or your doctor says it is okay. ? You may need to stay home longer to avoid giving your URI to others. ? You can also wear a face mask and wash your hands often to prevent spread of the virus.  Use your inhaler more if you have asthma.  Do not use any tobacco products, including cigarettes, chewing tobacco, or electronic cigarettes. If you need help quitting, ask your doctor. Contact a doctor if:  You are getting worse, not better.  Your symptoms are not helped by medicine.  You have chills.  You are getting more short of breath.  You have brown or  red mucus.  You have yellow or brown discharge from your nose.  You have pain in your face, especially when you bend forward.  You have a fever.  You have puffy (swollen) neck glands.  You have pain while swallowing.  You have white areas in the back of your throat. Get help right away if:  You have very bad or constant: ? Headache. ? Ear pain. ? Pain in your forehead, behind your eyes, and over your cheekbones (sinus pain). ? Chest pain.  You have long-lasting (chronic) lung disease and any of the following: ? Wheezing. ? Long-lasting cough. ? Coughing up blood. ? A change in your usual mucus.  You have a stiff neck.  You have changes in your: ? Vision. ? Hearing. ? Thinking. ? Mood. This information is not intended to replace advice given to you by your health care provider. Make sure you discuss any questions you have with your health care provider. Document Released: 09/04/2007 Document Revised: 11/19/2015 Document Reviewed: 06/23/2013 Elsevier Interactive Patient Education  2018 [employer]   IF you received an x-ray today, you will receive an invoice from [facility]. Please contact [facility] at [phone] with questions or concerns regarding your invoice.   IF you received labwork today, you will receive an invoice from [facility]. Please contact [facility] at [phone] with questions or concerns regarding your invoice.  Our billing staff will not be able to assist you with questions regarding bills from these companies.  You will be contacted with the lab results as soon as they are available. The fastest way to get your results is to activate your My Chart account. Instructions are located on the last page of this paperwork. If you have not heard from us regarding the results in 2 weeks, please contact this office.     

## 2017-07-03 LAB — CULTURE, GROUP A STREP

## 2017-07-05 ENCOUNTER — Other Ambulatory Visit: Payer: Self-pay | Admitting: Physician Assistant

## 2017-07-05 MED ORDER — AZITHROMYCIN 250 MG PO TABS: ORAL_TABLET | ORAL | 0 refills | Status: DC

## 2017-07-05 NOTE — Progress Notes (Signed)
Meds ordered this encounter  Medications  . azithromycin (ZITHROMAX) 250 MG tablet    Sig: Take 2 tabs PO x 1 dose, then 1 tab PO QD x 4 days    Dispense:  6 tablet    Refill:  0    Order Specific Question:   Supervising Provider    Answer:   SMITH, KRISTI M [[id number]      

## 2017-07-10 DIAGNOSIS — H1045 Other chronic allergic conjunctivitis: Secondary | ICD-10-CM | POA: Diagnosis not present

## 2017-07-28 ENCOUNTER — Other Ambulatory Visit: Payer: Self-pay | Admitting: Physician Assistant

## 2017-07-28 DIAGNOSIS — J069 Acute upper respiratory infection, unspecified: Secondary | ICD-10-CM

## 2017-09-12 ENCOUNTER — Encounter: Payer: Self-pay | Admitting: Family Medicine

## 2017-09-12 ENCOUNTER — Other Ambulatory Visit: Payer: Self-pay

## 2017-09-12 ENCOUNTER — Ambulatory Visit (INDEPENDENT_AMBULATORY_CARE_PROVIDER_SITE_OTHER): Payer: BC Managed Care – PPO | Admitting: Family Medicine

## 2017-09-12 ENCOUNTER — Ambulatory Visit (INDEPENDENT_AMBULATORY_CARE_PROVIDER_SITE_OTHER): Payer: BC Managed Care – PPO

## 2017-09-12 VITALS — BP 100/60 | HR 70 | Temp 98.1°F | Ht 66.5 in | Wt 217.0 lb

## 2017-09-12 DIAGNOSIS — R05 Cough: Secondary | ICD-10-CM

## 2017-09-12 DIAGNOSIS — R059 Cough, unspecified: Secondary | ICD-10-CM

## 2017-09-12 DIAGNOSIS — R062 Wheezing: Secondary | ICD-10-CM

## 2017-09-12 MED ORDER — OMEPRAZOLE 20 MG PO CPDR: 20.0000 mg | DELAYED_RELEASE_CAPSULE | Freq: Every day | ORAL | 1 refills | Status: DC

## 2017-09-12 MED ORDER — ALBUTEROL SULFATE HFA 108 (90 BASE) MCG/ACT IN AERS: 1.0000 | INHALATION_SPRAY | RESPIRATORY_TRACT | 0 refills | Status: DC | PRN

## 2017-09-12 NOTE — Progress Notes (Signed)
Subjective:  By signing my name below, I, Essence Howell, attest that this documentation has been prepared under the direction and in the presence of Shade Flood, MD Electronically Signed: Charline Bills, ED Scribe 09/12/2017 at 2:47 PM.   Patient ID: Jack Jensen, male    DOB: 05/05/1990, 27 y.o.   MRN: [id number]  Chief Complaint  Patient presents with  . Referral request    for pulmonologist for pt cant laugh w/o wheezing. going on for a year.   . Immuzation records    request for grad school.   HPI Jack Jensen is a 27 y.o. male who presents to Primary Care at [facility] for a referral request to pulmonology. Pt reports wheezing while coughing and laughing x 1.5 yr. He occasionally wheezes in cold weather as well but denies symptoms with exercising. Pt reports occasional heartburn every 3-6 months but none recently, intermittent nasal congestion, increased belching and flatulence x 2 yrs. Reports that he was exposed to biohazardous fungus while in [employer] in 2017. Denies unexplained weight loss, night sweats, fever. No treatments tried PTA. H/o allergies but not currently on medications for this.  Immunizations Immunization History  Administered Date(s) Administered  . PPD Test 11/02/2016  . Td 03/18/2014    Patient Active Problem List   Diagnosis Date Noted  . Routine general medical examination at a health care facility 11/02/2016  . Bilateral inguinal hernia 02/24/2012   Past Medical History:  Diagnosis Date  . Asthma   . No pertinent past medical history    Past Surgical History:  Procedure Laterality Date  . HERNIA REPAIR    . HERNIA REPAIR    . INGUINAL HERNIA REPAIR  03/16/2012   Procedure: LAPAROSCOPIC BILATERAL INGUINAL HERNIA REPAIR;  Surgeon: Shelly Rubenstein, MD;  Location: [facility];  Service: General;  Laterality: Bilateral;  . INSERTION OF MESH  03/16/2012   Procedure: INSERTION OF MESH;  Surgeon: Shelly Rubenstein, MD;  Location:   SURGERY CENTER;  Service: General;  Laterality: Bilateral;  . NO PAST SURGERIES     Allergies  Allergen Reactions  . Cephalexin Hives   Prior to Admission medications   Medication Sig Start Date End Date Taking? Authorizing Provider  azelastine (ASTELIN) 0.1 % nasal spray Place 2 sprays into both nostrils 2 (two) times daily. Use in each nostril as directed 07/01/17   Benjiman Core D, PA-C  azithromycin (ZITHROMAX) 250 MG tablet Take 2 tabs PO x 1 dose, then 1 tab PO QD x 4 days 07/05/17   Benjiman Core D, PA-C  benzonatate (TESSALON) 100 MG capsule Take 1-2 capsules (100-200 mg total) by mouth 3 (three) times daily as needed for cough. 07/01/17   Benjiman Core D, PA-C  Dextromethorphan-guaiFENesin (MUCINEX DM MAXIMUM STRENGTH) 60-1200 MG TB12 Take 1 tablet by mouth every 12 (twelve) hours. 07/01/17   Magdalene River, PA-C   Social History   Socioeconomic History  . Marital status: Single    Spouse name: Not on file  . Number of children: Not on file  . Years of education: Not on file  . Highest education level: Not on file  Occupational History  . Not on file  Social Needs  . Financial resource strain: Not on file  . Food insecurity:    Worry: Not on file    Inability: Not on file  . Transportation needs:    Medical: Not on file    Non-medical: Not on file  Tobacco  Use  . Smoking status: Never Smoker  . Smokeless tobacco: Never Used  Substance and Sexual Activity  . Alcohol use: Yes    Comment: socially  . Drug use: No  . Sexual activity: Not on file  Lifestyle  . Physical activity:    Days per week: Not on file    Minutes per session: Not on file  . Stress: Not on file  Relationships  . Social connections:    Talks on phone: Not on file    Gets together: Not on file    Attends religious service: Not on file    Active member of club or organization: Not on file    Attends meetings of clubs or organizations: Not on file    Relationship status:  Not on file  . Intimate partner violence:    Fear of current or ex partner: Not on file    Emotionally abused: Not on file    Physically abused: Not on file    Forced sexual activity: Not on file  Other Topics Concern  . Not on file  Social History Narrative  . Not on file   Review of Systems  Constitutional: Negative for diaphoresis, fever and unexpected weight change.  HENT: Positive for congestion (intermittent).   Respiratory: Positive for cough and wheezing.       Objective:   Physical Exam  Constitutional: He is oriented to person, place, and time. He appears well-developed and well-nourished.  HENT:  Head: Normocephalic and atraumatic.  Right Ear: External ear and ear canal normal. Tympanic membrane is scarred.  Left Ear: External ear and ear canal normal. Tympanic membrane is scarred.  Nose: No rhinorrhea.  Mouth/Throat: Oropharynx is clear and moist and mucous membranes are normal. No oropharyngeal exudate or posterior oropharyngeal erythema.  Canals are clear.  Eyes: Pupils are equal, round, and reactive to light. Conjunctivae are normal.  Neck: Neck supple.  Cardiovascular: Normal rate, regular rhythm, normal heart sounds and intact distal pulses.  No murmur heard. Pulmonary/Chest: Effort normal and breath sounds normal. No stridor. He has no wheezes. He has no rhonchi. He has no rales.  Abdominal: Soft. There is no tenderness.  Lymphadenopathy:    He has no cervical adenopathy.  Neurological: He is alert and oriented to person, place, and time.  Skin: Skin is warm and dry. No rash noted.  Psychiatric: He has a normal mood and affect. His behavior is normal.  Vitals reviewed.  Vitals:   09/12/17 1409  BP: 100/60  Pulse: 70  Temp: 98.1 F (36.7 C)  TempSrc: Oral  SpO2: 97%  Weight: 217 lb (98.4 kg)  Height: 5' 6.5" (1.689 m)  Dg Chest 2 View  Result Date: 09/12/2017 CLINICAL DATA:  Cough.  Wheezing.  Mold exposure. EXAM: CHEST - 2 VIEW COMPARISON:   04/11/2016 FINDINGS: Midline trachea.  Normal heart size and mediastinal contours. Sharp costophrenic angles.  No pneumothorax.  Clear lungs. IMPRESSION: No active cardiopulmonary disease. Electronically Signed   By: Jeronimo Greaves M.D.   On: 09/12/2017 15:27       Assessment & Plan:    Jack Jensen is a 26 y.o. male Cough - Plan: DG Chest 2 View  Wheeze - Plan: DG Chest 2 View  -Reassuring chest x-ray.  Does report possible exposure to mold, but current symptoms sound typical of upper airway cough syndrome.  Although multiple possible causes, discussed potential common causes.    -will initially treat for allergies and postnasal drip with Flonase nasal  spray, consider antihistamine.  Treat for reflux/laryngeal pharyngeal reflux with proton pump inhibitor the morning, H2 blocker at night.  Albuterol provided if needed for wheezing flares, and recheck next few weeks.  At that time if not significant improvement, would consider referral to pulmonologist.  RTC precautions if worsening  Meds ordered this encounter  Medications  . omeprazole (PRILOSEC) 20 MG capsule    Sig: Take 1 capsule (20 mg total) by mouth daily.    Dispense:  30 capsule    Refill:  1  . albuterol (PROVENTIL HFA;VENTOLIN HFA) 108 (90 Base) MCG/ACT inhaler    Sig: Inhale 1-2 puffs into the lungs every 4 (four) hours as needed for wheezing or shortness of breath.    Dispense:  1 Inhaler    Refill:  0   Patient Instructions   Cough and wheeze could be due to upper airway irritation as we discussed.  Start omeprazole once each morning, Zantac 150mg  (over the counter) at bedtime for heartburn cause.  See foods to avoid below.  For possible allergy cause, start Flonase nasal spray each day, and you can also take Zyrtec or Allegra over-the-counter once per day as well.  If you do have wheezing, can use the albuterol inhaler 1 to 2 puffs every 6 hours as needed only.  Recheck with me in the next 2 to 3 weeks to decide on next  step including possible pulmonary evaluation.  Return to the clinic or go to the nearest emergency room if any of your symptoms worsen or new symptoms occur.  Food Choices for Gastroesophageal Reflux Disease, Adult When you have gastroesophageal reflux disease (GERD), the foods you eat and your eating habits are very important. Choosing the right foods can help ease your discomfort. What guidelines do I need to follow?  Choose fruits, vegetables, whole grains, and low-fat dairy products.  Choose low-fat meat, fish, and poultry.  Limit fats such as oils, salad dressings, butter, nuts, and avocado.  Keep a food diary. This helps you identify foods that cause symptoms.  Avoid foods that cause symptoms. These may be different for everyone.  Eat small meals often instead of 3 large meals a day.  Eat your meals slowly, in a place where you are relaxed.  Limit fried foods.  Cook foods using methods other than frying.  Avoid drinking alcohol.  Avoid drinking large amounts of liquids with your meals.  Avoid bending over or lying down until 2-3 hours after eating. What foods are not recommended? These are some foods and drinks that may make your symptoms worse: Vegetables Tomatoes. Tomato juice. Tomato and spaghetti sauce. Chili peppers. Onion and garlic. Horseradish. Fruits Oranges, grapefruit, and lemon (fruit and juice). Meats High-fat meats, fish, and poultry. This includes hot dogs, ribs, ham, sausage, salami, and bacon. Dairy Whole milk and chocolate milk. Sour cream. Cream. Butter. Ice cream. Cream cheese. Drinks Coffee and tea. Bubbly (carbonated) drinks or energy drinks. Condiments Hot sauce. Barbecue sauce. Sweets/Desserts Chocolate and cocoa. Donuts. Peppermint and spearmint. Fats and Oils High-fat foods. This includes [place] fries and potato chips. Other Vinegar. Strong spices. This includes black pepper, white pepper, red pepper, cayenne, curry powder, cloves,  ginger, and chili powder. The items listed above may not be a complete list of foods and drinks to avoid. Contact your dietitian for more information. This information is not intended to replace advice given to you by your health care provider. Make sure you discuss any questions you have with your health care provider.  Document Released: 09/17/2011 Document Revised: 08/24/2015 Document Reviewed: 01/20/2013 Elsevier Interactive Patient Education  2017 Elsevier Inc.  Bronchospasm, Adult Bronchospasm is when airways in the lungs get smaller. When this happens, it can be hard to breathe. You may cough. You may also make a whistling sound when you breathe (wheeze). Follow these instructions at home: Medicines  Take over-the-counter and prescription medicines only as told by your doctor.  If you need to use an inhaler or nebulizer to take your medicine, ask your doctor how to use it.  If you were given a spacer, always use it with your inhaler. Lifestyle  Change your heating and air conditioning filter. Do this at least once a month.  Try not to use fireplaces and wood stoves.  Do not  smoke. Do not  allow smoking in your home.  Try not to use things that have a strong smell, like perfume.  Get rid of pests (such as roaches and mice) and their poop.  Remove any mold from your home.  Keep your house clean. Get rid of dust.  Use cleaning products that have no smell.  Replace carpet with wood, tile, or vinyl flooring.  Use allergy-proof pillows, mattress covers, and box spring covers.  Wash bed sheets and blankets every week. Use hot water. Dry them in a dryer.  Use blankets that are made of polyester or cotton.  Wash your hands often.  Keep pets out of your bedroom.  When you exercise, try not to breathe in cold air. General instructions  Have a plan for getting medical care. Know these things: ? When to call your doctor. ? When to call local emergency services (911 in  the [place] ? Where to go in an emergency.  Stay up to date on your shots (immunizations).  When you have an episode: ? Stay calm. ? Relax. ? Breathe slowly. Contact a doctor if:  Your muscles ache.  Your chest hurts.  The color of the mucus you cough up (sputum) changes from clear or white to yellow, green, gray, or bloody.  The mucus you cough up gets thicker.  You have a fever. Get help right away if:  The whistling sound gets worse, even after you take your medicines.  Your coughing gets worse.  You find it even harder to breathe.  Your chest hurts very much. Summary  Bronchospasm is when airways in the lungs get smaller.  When this happens, it can be hard to breathe. You may cough. You may also make a whistling sound when you breathe.  Stay away from things that cause you to have episodes. These include smoke or dust. This information is not intended to replace advice given to you by your health care provider. Make sure you discuss any questions you have with your health care provider. Document Released: 01/13/2009 Document Revised: 03/21/2016 Document Reviewed: 03/21/2016 Elsevier Interactive Patient Education  2017 Elsevier Inc.  Cough, Adult Coughing is a reflex that clears your throat and your airways. Coughing helps to heal and protect your lungs. It is normal to cough occasionally, but a cough that happens with other symptoms or lasts a long time may be a sign of a condition that needs treatment. A cough may last only 2-3 weeks (acute), or it may last longer than 8 weeks (chronic). What are the causes? Coughing is commonly caused by:  Breathing in substances that irritate your lungs.  A viral or bacterial respiratory infection.  Allergies.  Asthma.  Postnasal drip.  Smoking.  Acid backing up from the stomach into the esophagus (gastroesophageal reflux).  Certain medicines.  Chronic lung problems, including COPD (or rarely, lung cancer).  Other  medical conditions such as heart failure.  Follow these instructions at home: Pay attention to any changes in your symptoms. Take these actions to help with your discomfort:  Take medicines only as told by your health care provider. ? If you were prescribed an antibiotic medicine, take it as told by your health care provider. Do not stop taking the antibiotic even if you start to feel better. ? Talk with your health care provider before you take a cough suppressant medicine.  Drink enough fluid to keep your urine clear or pale yellow.  If the air is dry, use a cold steam vaporizer or humidifier in your bedroom or your home to help loosen secretions.  Avoid anything that causes you to cough at work or at home.  If your cough is worse at night, try sleeping in a semi-upright position.  Avoid cigarette smoke. If you smoke, quit smoking. If you need help quitting, ask your health care provider.  Avoid caffeine.  Avoid alcohol.  Rest as needed.  Contact a health care provider if:  You have new symptoms.  You cough up pus.  Your cough does not get better after 2-3 weeks, or your cough gets worse.  You cannot control your cough with suppressant medicines and you are losing sleep.  You develop pain that is getting worse or pain that is not controlled with pain medicines.  You have a fever.  You have unexplained weight loss.  You have night sweats. Get help right away if:  You cough up blood.  You have difficulty breathing.  Your heartbeat is very fast. This information is not intended to replace advice given to you by your health care provider. Make sure you discuss any questions you have with your health care provider. Document Released: 09/14/2010 Document Revised: 08/24/2015 Document Reviewed: 05/25/2014 Elsevier Interactive Patient Education  2018 [employer].      IF you received an x-ray today, you will receive an invoice from [facility]. Please  contact [facility] at [phone] with questions or concerns regarding your invoice.   IF you received labwork today, you will receive an invoice from [facility]. Please contact [facility] at [phone] with questions or concerns regarding your invoice.   Our billing staff will not be able to assist you with questions regarding bills from these companies.  You will be contacted with the lab results as soon as they are available. The fastest way to get your results is to activate your My Chart account. Instructions are located on the last page of this paperwork. If you have not heard from [place] regarding the results in 2 weeks, please contact this office.       I personally performed the services described in this documentation, which was scribed in my presence. The recorded information has been reviewed and considered for accuracy and completeness, addended by me as needed, and agree with information above.  Signed,   Meredith Staggers, MD Primary Care at [facility].  09/14/17 5:41 PM

## 2017-09-12 NOTE — Patient Instructions (Addendum)
Cough and wheeze could be due to upper airway irritation as we discussed.  Start omeprazole once each morning, Zantac 150mg  (over the counter) at bedtime for heartburn cause.  See foods to avoid below.  For possible allergy cause, start Flonase nasal spray each day, and you can also take Zyrtec or Allegra over-the-counter once per day as well.  If you do have wheezing, can use the albuterol inhaler 1 to 2 puffs every 6 hours as needed only.  Recheck with me in the next 2 to 3 weeks to decide on next step including possible pulmonary evaluation.  Return to the clinic or go to the nearest emergency room if any of your symptoms worsen or new symptoms occur.  Food Choices for Gastroesophageal Reflux Disease, Adult When you have gastroesophageal reflux disease (GERD), the foods you eat and your eating habits are very important. Choosing the right foods can help ease your discomfort. What guidelines do I need to follow?  Choose fruits, vegetables, whole grains, and low-fat dairy products.  Choose low-fat meat, fish, and poultry.  Limit fats such as oils, salad dressings, butter, nuts, and avocado.  Keep a food diary. This helps you identify foods that cause symptoms.  Avoid foods that cause symptoms. These may be different for everyone.  Eat small meals often instead of 3 large meals a day.  Eat your meals slowly, in a place where you are relaxed.  Limit fried foods.  Cook foods using methods other than frying.  Avoid drinking alcohol.  Avoid drinking large amounts of liquids with your meals.  Avoid bending over or lying down until 2-3 hours after eating. What foods are not recommended? These are some foods and drinks that may make your symptoms worse: Vegetables Tomatoes. Tomato juice. Tomato and spaghetti sauce. Chili peppers. Onion and garlic. Horseradish. Fruits Oranges, grapefruit, and lemon (fruit and juice). Meats High-fat meats, fish, and poultry. This includes hot dogs,  ribs, ham, sausage, salami, and bacon. Dairy Whole milk and chocolate milk. Sour cream. Cream. Butter. Ice cream. Cream cheese. Drinks Coffee and tea. Bubbly (carbonated) drinks or energy drinks. Condiments Hot sauce. Barbecue sauce. Sweets/Desserts Chocolate and cocoa. Donuts. Peppermint and spearmint. Fats and Oils High-fat foods. This includes [place] fries and potato chips. Other Vinegar. Strong spices. This includes black pepper, white pepper, red pepper, cayenne, curry powder, cloves, ginger, and chili powder. The items listed above may not be a complete list of foods and drinks to avoid. Contact your dietitian for more information. This information is not intended to replace advice given to you by your health care provider. Make sure you discuss any questions you have with your health care provider. Document Released: 09/17/2011 Document Revised: 08/24/2015 Document Reviewed: 01/20/2013 Elsevier Interactive Patient Education  2017 Elsevier Inc.  Bronchospasm, Adult Bronchospasm is when airways in the lungs get smaller. When this happens, it can be hard to breathe. You may cough. You may also make a whistling sound when you breathe (wheeze). Follow these instructions at home: Medicines  Take over-the-counter and prescription medicines only as told by your doctor.  If you need to use an inhaler or nebulizer to take your medicine, ask your doctor how to use it.  If you were given a spacer, always use it with your inhaler. Lifestyle  Change your heating and air conditioning filter. Do this at least once a month.  Try not to use fireplaces and wood stoves.  Do not  smoke. Do not  allow smoking in your home.  Try not to use things that have a strong smell, like perfume.  Get rid of pests (such as roaches and mice) and their poop.  Remove any mold from your home.  Keep your house clean. Get rid of dust.  Use cleaning products that have no smell.  Replace carpet with  wood, tile, or vinyl flooring.  Use allergy-proof pillows, mattress covers, and box spring covers.  Wash bed sheets and blankets every week. Use hot water. Dry them in a dryer.  Use blankets that are made of polyester or cotton.  Wash your hands often.  Keep pets out of your bedroom.  When you exercise, try not to breathe in cold air. General instructions  Have a plan for getting medical care. Know these things: ? When to call your doctor. ? When to call local emergency services (911 in the [place] ? Where to go in an emergency.  Stay up to date on your shots (immunizations).  When you have an episode: ? Stay calm. ? Relax. ? Breathe slowly. Contact a doctor if:  Your muscles ache.  Your chest hurts.  The color of the mucus you cough up (sputum) changes from clear or white to yellow, green, gray, or bloody.  The mucus you cough up gets thicker.  You have a fever. Get help right away if:  The whistling sound gets worse, even after you take your medicines.  Your coughing gets worse.  You find it even harder to breathe.  Your chest hurts very much. Summary  Bronchospasm is when airways in the lungs get smaller.  When this happens, it can be hard to breathe. You may cough. You may also make a whistling sound when you breathe.  Stay away from things that cause you to have episodes. These include smoke or dust. This information is not intended to replace advice given to you by your health care provider. Make sure you discuss any questions you have with your health care provider. Document Released: 01/13/2009 Document Revised: 03/21/2016 Document Reviewed: 03/21/2016 Elsevier Interactive Patient Education  2017 Elsevier Inc.  Cough, Adult Coughing is a reflex that clears your throat and your airways. Coughing helps to heal and protect your lungs. It is normal to cough occasionally, but a cough that happens with other symptoms or lasts a long time may be a sign of a  condition that needs treatment. A cough may last only 2-3 weeks (acute), or it may last longer than 8 weeks (chronic). What are the causes? Coughing is commonly caused by:  Breathing in substances that irritate your lungs.  A viral or bacterial respiratory infection.  Allergies.  Asthma.  Postnasal drip.  Smoking.  Acid backing up from the stomach into the esophagus (gastroesophageal reflux).  Certain medicines.  Chronic lung problems, including COPD (or rarely, lung cancer).  Other medical conditions such as heart failure.  Follow these instructions at home: Pay attention to any changes in your symptoms. Take these actions to help with your discomfort:  Take medicines only as told by your health care provider. ? If you were prescribed an antibiotic medicine, take it as told by your health care provider. Do not stop taking the antibiotic even if you start to feel better. ? Talk with your health care provider before you take a cough suppressant medicine.  Drink enough fluid to keep your urine clear or pale yellow.  If the air is dry, use a cold steam vaporizer or humidifier in your bedroom or your home to  help loosen secretions.  Avoid anything that causes you to cough at work or at home.  If your cough is worse at night, try sleeping in a semi-upright position.  Avoid cigarette smoke. If you smoke, quit smoking. If you need help quitting, ask your health care provider.  Avoid caffeine.  Avoid alcohol.  Rest as needed.  Contact a health care provider if:  You have new symptoms.  You cough up pus.  Your cough does not get better after 2-3 weeks, or your cough gets worse.  You cannot control your cough with suppressant medicines and you are losing sleep.  You develop pain that is getting worse or pain that is not controlled with pain medicines.  You have a fever.  You have unexplained weight loss.  You have night sweats. Get help right away if:  You  cough up blood.  You have difficulty breathing.  Your heartbeat is very fast. This information is not intended to replace advice given to you by your health care provider. Make sure you discuss any questions you have with your health care provider. Document Released: 09/14/2010 Document Revised: 08/24/2015 Document Reviewed: 05/25/2014 Elsevier Interactive Patient Education  2018 [employer].      IF you received an x-ray today, you will receive an invoice from [facility]. Please contact [facility] at [phone] with questions or concerns regarding your invoice.   IF you received labwork today, you will receive an invoice from [place]. Please contact [facility] at [phone] with questions or concerns regarding your invoice.   Our billing staff will not be able to assist you with questions regarding bills from these companies.  You will be contacted with the lab results as soon as they are available. The fastest way to get your results is to activate your My Chart account. Instructions are located on the last page of this paperwork. If you have not heard from [place] regarding the results in 2 weeks, please contact this office.

## 2017-09-25 DIAGNOSIS — F320 Major depressive disorder, single episode, mild: Secondary | ICD-10-CM | POA: Diagnosis not present

## 2017-09-25 DIAGNOSIS — F4323 Adjustment disorder with mixed anxiety and depressed mood: Secondary | ICD-10-CM | POA: Diagnosis not present

## 2017-09-25 DIAGNOSIS — F901 Attention-deficit hyperactivity disorder, predominantly hyperactive type: Secondary | ICD-10-CM | POA: Diagnosis not present

## 2017-09-30 ENCOUNTER — Other Ambulatory Visit: Payer: Self-pay

## 2017-09-30 ENCOUNTER — Ambulatory Visit: Payer: BC Managed Care – PPO | Admitting: Family Medicine

## 2017-09-30 ENCOUNTER — Encounter: Payer: Self-pay | Admitting: Family Medicine

## 2017-09-30 VITALS — BP 118/64 | HR 64 | Temp 97.8°F | Resp 16 | Ht 66.5 in | Wt 211.8 lb

## 2017-09-30 DIAGNOSIS — S91331A Puncture wound without foreign body, right foot, initial encounter: Secondary | ICD-10-CM

## 2017-09-30 DIAGNOSIS — Z23 Encounter for immunization: Secondary | ICD-10-CM

## 2017-09-30 MED ORDER — MUPIROCIN 2 % EX OINT
1.0000 | TOPICAL_OINTMENT | Freq: Four times a day (QID) | CUTANEOUS | 0 refills | Status: DC
Start: 2017-09-30 — End: 2017-11-04

## 2017-09-30 NOTE — Progress Notes (Signed)
Subjective:  By signing my name below, I, Jack Jensen, attest that this documentation has been prepared under the direction and in the presence of Jack Sorenson, MD. Electronically Signed: Stann Jensen, Scribe. 09/30/2017 , 2:43 PM .  Patient was seen in Room 2 .   Patient ID: Jack Jensen, male    DOB: [birth year], 27 y.o.   MRN: [id number] Chief Complaint  Patient presents with  . stepped on nail today at 12:15 pm right foot   HPI [name] is a 27 y.o. male who presents to Primary Care at [facility] complaining of right foot injury after stepping on a nail earlier today at 12:15 PM. After he stepped onto a rusty nail into his right foot, he pulled the nail out which drew some blood. Then, he proceeded to taking a shower. His last tetanus was in 2014.   r foot, exam   Past Medical History:  Diagnosis Date  . Asthma   . No pertinent past medical history    Past Surgical History:  Procedure Laterality Date  . HERNIA REPAIR    . HERNIA REPAIR    . INGUINAL HERNIA REPAIR  03/16/2012   Procedure: LAPAROSCOPIC BILATERAL INGUINAL HERNIA REPAIR;  Surgeon: Shelly Rubenstein, MD;  Location: [facility];  Service: General;  Laterality: Bilateral;  . INSERTION OF MESH  03/16/2012   Procedure: INSERTION OF MESH;  Surgeon: Shelly Rubenstein, MD;  Location: [facility];  Service: General;  Laterality: Bilateral;  . NO PAST SURGERIES     Prior to Admission medications   Medication Sig Start Date End Date Taking? Authorizing Provider  albuterol (PROVENTIL HFA;VENTOLIN HFA) 108 (90 Base) MCG/ACT inhaler Inhale 1-2 puffs into the lungs every 4 (four) hours as needed for wheezing or shortness of breath. 09/12/17   Shade Flood, MD  omeprazole (PRILOSEC) 20 MG capsule Take 1 capsule (20 mg total) by mouth daily. 09/12/17   Shade Flood, MD   Allergies  Allergen Reactions  . Cephalexin Hives   No family history on file. Social History   Socioeconomic  History  . Marital status: Single    Spouse name: Not on file  . Number of children: Not on file  . Years of education: Not on file  . Highest education level: Not on file  Occupational History  . Not on file  Social Needs  . Financial resource strain: Not on file  . Food insecurity:    Worry: Not on file    Inability: Not on file  . Transportation needs:    Medical: Not on file    Non-medical: Not on file  Tobacco Use  . Smoking status: Never Smoker  . Smokeless tobacco: Never Used  Substance and Sexual Activity  . Alcohol use: Yes    Comment: socially  . Drug use: No  . Sexual activity: Not on file  Lifestyle  . Physical activity:    Days per week: Not on file    Minutes per session: Not on file  . Stress: Not on file  Relationships  . Social connections:    Talks on phone: Not on file    Gets together: Not on file    Attends religious service: Not on file    Active member of club or organization: Not on file    Attends meetings of clubs or organizations: Not on file    Relationship status: Not on file  Other Topics Concern  . Not on  file  Social History Narrative  . Not on file   Depression screen [facility] 2/9 09/30/2017 09/12/2017 07/01/2017 11/02/2016 10/30/2016  Decreased Interest 0 0 0 0 0  Down, Depressed, Hopeless 0 0 0 0 0  PHQ - 2 Score 0 0 0 0 0    Review of Systems See hpi    Objective:   Physical Exam  Constitutional: He is oriented to person, place, and time. He appears well-developed and well-nourished. No distress.  HENT:  Head: Normocephalic and atraumatic.  Eyes: Pupils are equal, round, and reactive to light. EOM are normal.  Neck: Neck supple.  Cardiovascular: Normal rate.  Pulmonary/Chest: Effort normal. No respiratory distress.  Musculoskeletal: Normal range of motion.  Neurological: He is alert and oriented to person, place, and time.  Skin: Skin is warm and dry.  Right foot: pinpoint 1 mm puncture wound broken just through derma layer, no  active bleeding, minimal tenderness, no warmth, drainage, fluctuance, or induration  Psychiatric: He has a normal mood and affect. His behavior is normal.  Nursing note and vitals reviewed.   BP 118/64 (BP Location: Left Arm, Patient Position: Sitting, Cuff Size: Normal)   Pulse 64   Temp 97.8 F (36.6 C) (Oral)   Resp 16   Ht 5' 6.5" (1.689 m)   Wt 211 lb 12.8 oz (96.1 kg)   SpO2 97%   BMI 33.67 kg/m    Wound explored with TB needle, no retained foreign bodies seen. Flushed wound with 15 cc sterile saline. Dressing applied with mupirocin.      Assessment & Plan:   1. Puncture wound of right foot, initial encounter     Orders Placed This Encounter  Procedures  . Tdap vaccine greater than or equal to 7yo IM    Meds ordered this encounter  Medications  . mupirocin ointment (BACTROBAN) 2 %    Sig: Apply 1 application topically 4 (four) times daily.    Dispense:  30 g    Refill:  0   I personally performed the services described in this documentation, which was scribed in my presence. The recorded information has been reviewed and considered, and addended by me as needed.   Jack SorensonEva Kanyia Heaslip, M.D.  [facility]  [place] [address] [place], [place] [phone] phone [phone] fax  01/15/18 1:10 PM

## 2017-09-30 NOTE — Patient Instructions (Addendum)
Soak your foot in hot/warm water - put soap, tea tree oil, hydrogen peroxide or other antibacterial cleanser - four times a day followed by topical mupirocin x 3d. Then back to normal    IF you received an x-ray today, you will receive an invoice from [facility]. Please contact [facility] at [phone] with questions or concerns regarding your invoice.   IF you received labwork today, you will receive an invoice from [place]. Please contact [facility] at [phone] with questions or concerns regarding your invoice.   Our billing staff will not be able to assist you with questions regarding bills from these companies.  You will be contacted with the lab results as soon as they are available. The fastest way to get your results is to activate your My Chart account. Instructions are located on the last page of this paperwork. If you have not heard from [place] regarding the results in 2 weeks, please contact this office.     Puncture Wound A puncture wound is an injury that is caused by a sharp, thin object that goes through (penetrates) your skin. Usually, a puncture wound does not leave a large opening in your skin, so it may not bleed a lot. However, when you get a puncture wound, dirt or other materials (foreign bodies) can be forced into your wound and break off inside. This increases the chance of infection, such as tetanus. What are the causes? Puncture wounds are caused by any sharp, thin object that goes through your skin, such as:  Animal teeth, as with an animal bite.  Sharp, pointed objects, such as nails, splinters of glass, fishhooks, and needles.  What are the signs or symptoms? Symptoms of a puncture wound include:  Pain.  Bleeding.  Swelling.  Bruising.  Fluid leaking from the wound.  Numbness, tingling, or loss of function.  How is this diagnosed? This condition is diagnosed with a medical history and physical exam. Your wound will be checked to  see if it contains any foreign bodies. You may also have X-rays or other imaging tests. How is this treated? Treatment for a puncture wound depends on how serious the wound is. It also depends on whether the wound contains any foreign bodies. Treatment for all types of puncture wounds usually starts with:  Controlling the bleeding.  Washing out the wound with a germ-free (sterile) salt-water solution.  Checking the wound for foreign bodies.  Treatment may also include:  Having the wound opened surgically to remove a foreign object.  Closing the wound with stitches (sutures) if it continues to bleed.  Covering the wound with antibiotic ointments and a bandage (dressing).  Receiving a tetanus shot.  Receiving a rabies vaccine.  Follow these instructions at home: Medicines  Take or apply over-the-counter and prescription medicines only as told by your health care provider.  If you were prescribed an antibiotic, take or apply it as told by your health care provider. Do not stop using the antibiotic even if your condition improves. Wound care  There are many ways to close and cover a wound. For example, a wound can be covered with sutures, skin glue, or adhesive strips.Follow instructions from your health care provider about: ? How to take care of your wound. ? When and how you should change your dressing. ? When you should remove your dressing. ? Removing whatever was used to close your wound.  Keep the dressing dry as told by your health care provider. Do not take baths, swim,  use a hot tub, or do anything that would put your wound underwater until your health care provider approves.  Clean the wound as told by your health care provider.  Do not scratch or pick at the wound.  Check your wound every day for signs of infection. Watch for: ? Redness, swelling, or pain. ? Fluid, blood, or pus. General instructions  Raise (elevate) the injured area above the level of your  heart while you are sitting or lying down.  If your puncture wound is in your foot, ask your health care provider if you need to avoid putting weight on your foot and for how long.  Keep all follow-up visits as told by your health care provider. This is important. Contact a health care provider if:  You received a tetanus shot and you have swelling, severe pain, redness, or bleeding at the injection site.  You have a fever.  Your sutures come out.  You notice a bad smell coming from your wound or your dressing.  You notice something coming out of your wound, such as wood or glass.  Your pain is not controlled with medicine.  You have increased redness, swelling, or pain at the site of your wound.  You have fluid, blood, or pus coming from your wound.  You notice a change in the color of your skin near your wound.  You need to change the dressing frequently due to fluid, blood, or pus draining from your wound.  You develop a new rash.  You develop numbness around your wound. Get help right away if:  You develop severe swelling around your wound.  Your pain suddenly increases and is severe.  You develop painful skin lumps.  You have a red streak going away from your wound.  The wound is on your hand or foot and you cannot properly move a finger or toe.  The wound is on your hand or foot and you notice that your fingers or toes look pale or bluish. This information is not intended to replace advice given to you by your health care provider. Make sure you discuss any questions you have with your health care provider. Document Released: 12/26/2004 Document Revised: 08/21/2015 Document Reviewed: 05/11/2014 Elsevier Interactive Patient Education  [employer]2018 Elsevier Inc.

## 2017-10-06 ENCOUNTER — Ambulatory Visit: Payer: Self-pay | Admitting: Family Medicine

## 2017-10-08 DIAGNOSIS — F4323 Adjustment disorder with mixed anxiety and depressed mood: Secondary | ICD-10-CM | POA: Diagnosis not present

## 2017-10-08 DIAGNOSIS — F901 Attention-deficit hyperactivity disorder, predominantly hyperactive type: Secondary | ICD-10-CM | POA: Diagnosis not present

## 2017-10-08 DIAGNOSIS — F320 Major depressive disorder, single episode, mild: Secondary | ICD-10-CM | POA: Diagnosis not present

## 2017-10-17 ENCOUNTER — Other Ambulatory Visit: Payer: Self-pay | Admitting: Family Medicine

## 2017-10-17 NOTE — Telephone Encounter (Signed)
Albuterol HFA refill Last Refill:09/12/17 1 inhaler  Last OV: 09/12/17 PCP: Benjiman CoreBrittany Wiseman PA Pharmacy:[facility] [address]

## 2017-10-21 DIAGNOSIS — F320 Major depressive disorder, single episode, mild: Secondary | ICD-10-CM | POA: Diagnosis not present

## 2017-10-21 DIAGNOSIS — F901 Attention-deficit hyperactivity disorder, predominantly hyperactive type: Secondary | ICD-10-CM | POA: Diagnosis not present

## 2017-10-21 DIAGNOSIS — F4323 Adjustment disorder with mixed anxiety and depressed mood: Secondary | ICD-10-CM | POA: Diagnosis not present

## 2017-11-04 ENCOUNTER — Other Ambulatory Visit: Payer: Self-pay

## 2017-11-04 ENCOUNTER — Encounter: Payer: Self-pay | Admitting: Family Medicine

## 2017-11-04 ENCOUNTER — Ambulatory Visit: Payer: BC Managed Care – PPO | Admitting: Family Medicine

## 2017-11-04 VITALS — BP 112/70 | HR 50 | Temp 98.5°F | Ht 66.0 in | Wt 215.4 lb

## 2017-11-04 DIAGNOSIS — R590 Localized enlarged lymph nodes: Secondary | ICD-10-CM | POA: Diagnosis not present

## 2017-11-04 DIAGNOSIS — Z113 Encounter for screening for infections with a predominantly sexual mode of transmission: Secondary | ICD-10-CM

## 2017-11-04 NOTE — Patient Instructions (Addendum)
Bump in the groin could be due to a swollen lymph node.  As we discussed that can come from a number of causes, sometimes infection.  We will check for sexually transmitted infections, but the area on her ankle could have also caused that lymph node to swell.  Please recheck in the next 1 week to decide if imaging or other testing needed.  If you have any increased swelling or pain in that area, return here or emergency room but unlikely hernia based on exam today.  Return to the clinic or go to the nearest emergency room if any of your symptoms worsen or new symptoms occur.  Lymphadenopathy Lymphadenopathy refers to swollen or enlarged lymph glands, also called lymph nodes. Lymph glands are part of your body's defense (immune) system, which protects the body from infections, germs, and diseases. Lymph glands are found in many locations in your body, including the neck, underarm, and groin. Many things can cause lymph glands to become enlarged. When your immune system responds to germs, such as viruses or bacteria, infection-fighting cells and fluid build up. This causes the glands to grow in size. Usually, this is not something to worry about. The swelling and any soreness often go away without treatment. However, swollen lymph glands can also be caused by a number of diseases. Your health care provider may do various tests to help determine the cause. If the cause of your swollen lymph glands cannot be found, it is important to monitor your condition to make sure the swelling goes away. Follow these instructions at home: Watch your condition for any changes. The following actions may help to lessen any discomfort you are feeling:  Get plenty of rest.  Take medicines only as directed by your health care provider. Your health care provider may recommend over-the-counter medicines for pain.  Apply moist heat compresses to the site of swollen lymph nodes as directed by your health care provider. This  can help reduce any pain.  Check your lymph nodes daily for any changes.  Keep all follow-up visits as directed by your health care provider. This is important.  Contact a health care provider if:  Your lymph nodes are still swollen after 2 weeks.  Your swelling increases or spreads to other areas.  Your lymph nodes are hard, seem fixed to the skin, or are growing rapidly.  Your skin over the lymph nodes is red and inflamed.  You have a fever.  You have chills.  You have fatigue.  You develop a sore throat.  You have abdominal pain.  You have weight loss.  You have night sweats. Get help right away if:  You notice fluid leaking from the area of the enlarged lymph node.  You have severe pain in any area of your body.  You have chest pain.  You have shortness of breath. This information is not intended to replace advice given to you by your health care provider. Make sure you discuss any questions you have with your health care provider. Document Released: 12/26/2007 Document Revised: 08/24/2015 Document Reviewed: 10/21/2013 Elsevier Interactive Patient Education  2018 [employer]    IF you received an x-ray today, you will receive an invoice from [facility]. Please contact [facility] at [phone] with questions or concerns regarding your invoice.   IF you received labwork today, you will receive an invoice from [facility]. Please contact [facility] at [phone] with questions or concerns regarding your invoice.   Our billing staff will not be able  to assist you with questions regarding bills from these companies.  You will be contacted with the lab results as soon as they are available. The fastest way to get your results is to activate your My Chart account. Instructions are located on the last page of this paperwork. If you have not heard from us regarding the results in 2 weeks, please contact this office.      

## 2017-11-04 NOTE — Progress Notes (Signed)
Subjective:    Patient ID: Jack Jensen, male    DOB: 1990-08-11, 27 y.o.   MRN: [id number]  HPI Jack Jensen is a 27 y.o. male Presents today for: Chief Complaint  Patient presents with  . left groin area pain    near the thigh. going on 2 days   Here for left groin pain.  History of bilateral inguinal hernia, repaired in 2013 by Dr. Carman Ching.  Flashes of discomfort past few weeks, with line of soreness into left thigh. Feels similar to prior hernia. Does note bulge in area, but not sure if bone. More sore in past 2 days - sore to touch, or with bending.  No bowel irregularites, no blood in stool. Normal BM this am. No fever, no skin color change. No back pain or radiating pain from back.  Monogamous with single partner, but none to take".  Denies any penile discharge, testicular pain, genital rash, or ulcerations.  Healing wound on lower left ankle form last week.   Tx: none.    Patient Active Problem List   Diagnosis Date Noted  . Routine general medical examination at a health care facility 11/02/2016  . Bilateral inguinal hernia 02/24/2012   Past Medical History:  Diagnosis Date  . Asthma   . No pertinent past medical history    Past Surgical History:  Procedure Laterality Date  . HERNIA REPAIR    . HERNIA REPAIR    . INGUINAL HERNIA REPAIR  03/16/2012   Procedure: LAPAROSCOPIC BILATERAL INGUINAL HERNIA REPAIR;  Surgeon: Shelly Rubenstein, MD;  Location: [facility];  Service: General;  Laterality: Bilateral;  . INSERTION OF MESH  03/16/2012   Procedure: INSERTION OF MESH;  Surgeon: Shelly Rubenstein, MD;  Location: [facility];  Service: General;  Laterality: Bilateral;  . NO PAST SURGERIES     Allergies  Allergen Reactions  . Cephalexin Hives   Prior to Admission medications   Medication Sig Start Date End Date Taking? Authorizing Provider  methylphenidate (RITALIN) 10 MG tablet Take by mouth.    [provider]    Social History   Socioeconomic History  . Marital status: Single    Spouse name: Not on file  . Number of children: Not on file  . Years of education: Not on file  . Highest education level: Not on file  Occupational History  . Not on file  Social Needs  . Financial resource strain: Not on file  . Food insecurity:    Worry: Not on file    Inability: Not on file  . Transportation needs:    Medical: Not on file    Non-medical: Not on file  Tobacco Use  . Smoking status: Never Smoker  . Smokeless tobacco: Never Used  Substance and Sexual Activity  . Alcohol use: Yes    Comment: socially  . Drug use: No  . Sexual activity: Not on file  Lifestyle  . Physical activity:    Days per week: Not on file    Minutes per session: Not on file  . Stress: Not on file  Relationships  . Social connections:    Talks on phone: Not on file    Gets together: Not on file    Attends religious service: Not on file    Active member of club or organization: Not on file    Attends meetings of clubs or organizations: Not on file    Relationship status: Not on file  .  Intimate partner violence:    Fear of current or ex partner: Not on file    Emotionally abused: Not on file    Physically abused: Not on file    Forced sexual activity: Not on file  Other Topics Concern  . Not on file  Social History Narrative  . Not on file    Review of Systems  Constitutional: Negative for chills, diaphoresis, fever and unexpected weight change.  Gastrointestinal: Negative for abdominal pain, blood in stool, constipation and diarrhea.  Genitourinary: Negative for difficulty urinating, discharge, flank pain, frequency, genital sores, scrotal swelling and testicular pain.  Skin: Negative for color change and rash.       Objective:   Physical Exam  Constitutional: He is oriented to person, place, and time. He appears well-developed and well-nourished. No distress.  HENT:  Head: Normocephalic and  atraumatic.  Cardiovascular: Normal rate.  Pulmonary/Chest: Effort normal.  Abdominal: There is no hepatosplenomegaly. No hernia. Hernia confirmed negative in the ventral area, confirmed negative in the right inguinal area and confirmed negative in the left inguinal area.  Genitourinary: Penis normal. Right testis shows no mass, no swelling and no tenderness. Left testis shows no mass, no swelling and no tenderness. Uncircumcised. No discharge found.  Lymphadenopathy: Inguinal adenopathy noted on the left side. No inguinal adenopathy noted on the right side.  Neurological: He is alert and oriented to person, place, and time.  Skin: Skin is warm and dry.  Psychiatric: He has a normal mood and affect.   Vitals:   11/04/17 1545  BP: 112/70  Pulse: (!) 50  Temp: 98.5 F (36.9 C)  TempSrc: Oral  SpO2: 97%  Weight: 215 lb 6.4 oz (97.7 kg)  Height: 5\' 6"  (1.676 m)       Assessment & Plan:  Jack Jensen is a 27 y.o. male Routine screening for STI (sexually transmitted infection) - Plan: HIV antibody, RPR, GC/Chlamydia Probe Amp  Inguinal lymphadenopathy - Plan: HIV antibody, RPR, GC/Chlamydia Probe Amp, CBC  Suspected reactive lymphadenopathy of left inguinal node.  No surrounding rash, no testicular or penile rash/discharge.  Small healing wound on lower leg could have been because.  Check STI testing as above, symptomatic care and RTC precautions.  .  Does not appear to be hernia at this time.  Recheck 1 week if not improved to consider imaging versus versus other testing. If he has worsening symptoms during that time, or any associated genital rash, could consider treatment for LGV, but unlikely at this time.  No orders of the defined types were placed in this encounter.  Patient Instructions    Bump in the groin could be due to a swollen lymph node.  As we discussed that can come from a number of causes, sometimes infection.  We will check for sexually transmitted infections, but the  area on her ankle could have also caused that lymph node to swell.  Please recheck in the next 1 week to decide if imaging or other testing needed.  If you have any increased swelling or pain in that area, return here or emergency room but unlikely hernia based on exam today.  Return to the clinic or go to the nearest emergency room if any of your symptoms worsen or new symptoms occur.  Lymphadenopathy Lymphadenopathy refers to swollen or enlarged lymph glands, also called lymph nodes. Lymph glands are part of your body's defense (immune) system, which protects the body from infections, germs, and diseases. Lymph glands are found in  many locations in your body, including the neck, underarm, and groin. Many things can cause lymph glands to become enlarged. When your immune system responds to germs, such as viruses or bacteria, infection-fighting cells and fluid build up. This causes the glands to grow in size. Usually, this is not something to worry about. The swelling and any soreness often go away without treatment. However, swollen lymph glands can also be caused by a number of diseases. Your health care provider may do various tests to help determine the cause. If the cause of your swollen lymph glands cannot be found, it is important to monitor your condition to make sure the swelling goes away. Follow these instructions at home: Watch your condition for any changes. The following actions may help to lessen any discomfort you are feeling:  Get plenty of rest.  Take medicines only as directed by your health care provider. Your health care provider may recommend over-the-counter medicines for pain.  Apply moist heat compresses to the site of swollen lymph nodes as directed by your health care provider. This can help reduce any pain.  Check your lymph nodes daily for any changes.  Keep all follow-up visits as directed by your health care provider. This is important.  Contact a health care  provider if:  Your lymph nodes are still swollen after 2 weeks.  Your swelling increases or spreads to other areas.  Your lymph nodes are hard, seem fixed to the skin, or are growing rapidly.  Your skin over the lymph nodes is red and inflamed.  You have a fever.  You have chills.  You have fatigue.  You develop a sore throat.  You have abdominal pain.  You have weight loss.  You have night sweats. Get help right away if:  You notice fluid leaking from the area of the enlarged lymph node.  You have severe pain in any area of your body.  You have chest pain.  You have shortness of breath. This information is not intended to replace advice given to you by your health care provider. Make sure you discuss any questions you have with your health care provider. Document Released: 12/26/2007 Document Revised: 08/24/2015 Document Reviewed: 10/21/2013 Elsevier Interactive Patient Education  2018 [employer]    IF you received an x-ray today, you will receive an invoice from [facility]. Please contact [facility] at [phone] with questions or concerns regarding your invoice.   IF you received labwork today, you will receive an invoice from [facility]. Please contact [facility] at [phone] with questions or concerns regarding your invoice.   Our billing staff will not be able to assist you with questions regarding bills from these companies.  You will be contacted with the lab results as soon as they are available. The fastest way to get your results is to activate your My Chart account. Instructions are located on the last page of this paperwork. If you have not heard from us regarding the results in 2 weeks, please contact this office.       Signed,   Meredith StaggersJeffrey Alfonse Garringer, MD Primary Care at [facility].  11/09/17 10:52 AM

## 2017-11-05 LAB — CBC
Hematocrit: 42.3 % (ref 37.5–51.0)
Hemoglobin: 14.3 g/dL (ref 13.0–17.7)
MCH: 30.5 pg (ref 26.6–33.0)
MCHC: 33.8 g/dL (ref 31.5–35.7)
MCV: 90 fL (ref 79–97)
Platelets: 330 10*3/uL (ref 150–450)
RBC: 4.69 x10E6/uL (ref 4.14–5.80)
RDW: 14.1 % (ref 12.3–15.4)
WBC: 8.2 10*3/uL (ref 3.4–10.8)

## 2017-11-05 LAB — RPR: RPR Ser Ql: NONREACTIVE

## 2017-11-05 LAB — HIV ANTIBODY (ROUTINE TESTING W REFLEX): HIV Screen 4th Generation wRfx: NONREACTIVE

## 2017-11-06 LAB — GC/CHLAMYDIA PROBE AMP
Chlamydia trachomatis, NAA: NEGATIVE
Neisseria gonorrhoeae by PCR: NEGATIVE

## 2017-11-09 ENCOUNTER — Encounter: Payer: Self-pay | Admitting: Family Medicine

## 2017-11-11 ENCOUNTER — Other Ambulatory Visit: Payer: Self-pay

## 2017-11-11 ENCOUNTER — Encounter: Payer: Self-pay | Admitting: Family Medicine

## 2017-11-11 ENCOUNTER — Ambulatory Visit: Payer: BC Managed Care – PPO | Admitting: Family Medicine

## 2017-11-11 VITALS — BP 120/68 | HR 70 | Temp 98.5°F | Ht 66.0 in | Wt 215.4 lb

## 2017-11-11 DIAGNOSIS — S81802A Unspecified open wound, left lower leg, initial encounter: Secondary | ICD-10-CM | POA: Diagnosis not present

## 2017-11-11 DIAGNOSIS — R590 Localized enlarged lymph nodes: Secondary | ICD-10-CM | POA: Diagnosis not present

## 2017-11-11 MED ORDER — DOXYCYCLINE HYCLATE 100 MG PO TABS: 100.0000 mg | ORAL_TABLET | Freq: Two times a day (BID) | ORAL | 0 refills | Status: AC

## 2017-11-11 NOTE — Progress Notes (Signed)
Subjective:  By signing my name below, I, Jack Jensen, attest that this documentation has been prepared under the direction and in the presence of Jack Flood, MD Electronically Signed: Greer Jensen Medical Scribe 11/11/2017   Patient ID: Jack Jensen, male    DOB: 1991/01/08, 27 y.o.   MRN: [id number] Chief Complaint  Patient presents with  . Groin Pain Recheck    HPI Jack Jensen is a 27 y.o. male who presents to Primary Care at [facility] complaining of was seen august 6th, with left sided groin pain. Episodic for a few weeks, with soreness into his left thigh, more soreness prior two days. No rash, no GU symptoms or STI symptoms. Suspected reactive left inguinal node, possibly from wound in lower leg that was healing. STI testing was performed including GC, CT, HIV and RPR all negative/ non-reactive.  Patient reports that pain is gone, bumps are still there. He reports a new bump being in the lower area below initial LN. Denies having a new rashes or night sweats.  No new treatments for rash or swelling.  No fevers, no penile discharge or new genital rash.  He did notice a new wound 1 day after his last visit in the lower leg.  New bump in the groin appeared after that lesion.   Patient Active Problem List   Diagnosis Date Noted  . Routine general medical examination at a health care facility 11/02/2016  . Bilateral inguinal hernia 02/24/2012   Past Medical History:  Diagnosis Date  . Asthma   . No pertinent past medical history    Past Surgical History:  Procedure Laterality Date  . HERNIA REPAIR    . HERNIA REPAIR    . INGUINAL HERNIA REPAIR  03/16/2012   Procedure: LAPAROSCOPIC BILATERAL INGUINAL HERNIA REPAIR;  Surgeon: Shelly Rubenstein, MD;  Location: [facility];  Service: General;  Laterality: Bilateral;  . INSERTION OF MESH  03/16/2012   Procedure: INSERTION OF MESH;  Surgeon: Shelly Rubenstein, MD;  Location: [facility];  Service:  General;  Laterality: Bilateral;  . NO PAST SURGERIES     Allergies  Allergen Reactions  . Cephalexin Hives   Prior to Admission medications   Medication Sig Start Date End Date Taking? Authorizing Provider  methylphenidate (RITALIN) 10 MG tablet Take by mouth.    [provider]   Social History   Socioeconomic History  . Marital status: Single    Spouse name: Not on file  . Number of children: Not on file  . Years of education: Not on file  . Highest education level: Not on file  Occupational History  . Not on file  Social Needs  . Financial resource strain: Not on file  . Food insecurity:    Worry: Not on file    Inability: Not on file  . Transportation needs:    Medical: Not on file    Non-medical: Not on file  Tobacco Use  . Smoking status: Never Smoker  . Smokeless tobacco: Never Used  Substance and Sexual Activity  . Alcohol use: Yes    Comment: socially  . Drug use: No  . Sexual activity: Not on file  Lifestyle  . Physical activity:    Days per week: Not on file    Minutes per session: Not on file  . Stress: Not on file  Relationships  . Social connections:    Talks on phone: Not on file    Gets  together: Not on file    Attends religious service: Not on file    Active member of club or organization: Not on file    Attends meetings of clubs or organizations: Not on file    Relationship status: Not on file  . Intimate partner violence:    Fear of current or ex partner: Not on file    Emotionally abused: Not on file    Physically abused: Not on file    Forced sexual activity: Not on file  Other Topics Concern  . Not on file  Social History Narrative  . Not on file      Review of Systems Per HPI.     Objective:   Physical Exam  Constitutional: He is oriented to person, place, and time. He appears well-developed and well-nourished. No distress.  HENT:  Head: Normocephalic and atraumatic.  Cardiovascular: Normal rate.    Pulmonary/Chest: Effort normal.  Genitourinary:  Genitourinary Comments: Left inguinal node is minimally tender 2.5 cm across, distal to that area there is a firm area of 1 x 3 cm slightly tender.  Musculoskeletal:       Legs: In the lower leg, lateral to the mid calf, there is a small wound 1 mm across with 5 mm shallow center laceration (started 1 day after his last visit).  Neurological: He is alert and oriented to person, place, and time.  Psychiatric: He has a normal mood and affect.      Vitals:   11/11/17 1448  BP: 120/68  Pulse: 70  Temp: 98.5 F (36.9 C)  TempSrc: Oral  SpO2: 98%  Weight: 215 lb 6.4 oz (97.7 kg)  Height: 5\' 6"  (1.676 m)    Assessment & Plan:   Jack Khanathan F Weppler is a 27 y.o. male Lymphadenopathy, inguinal - Plan: doxycycline (VIBRA-TABS) 100 MG tablet  Wound of left lower extremity, initial encounter - Plan: doxycycline (VIBRA-TABS) 100 MG tablet  Persistent nodular area in left groin, suspected reactive lymphadenopathy initially, now with new lesion just distal.  However does note a new wound in his lower leg.  Initial area had decreased in discomfort, and no new fevers, night sweats.  Still possible reactive lymphadenopathy given wound on leg.   - Will try initial treatment with doxycycline, cleanse lower skin wound with soap and water twice per day and cover to keep clean.   -If the swelling in the groin is not improving within the next week to 10 days or any worsening symptoms prior to that time, would recommend imaging with ultrasound initially and possible biopsy.  RTC precautions discussed.  Meds ordered this encounter  Medications  . doxycycline (VIBRA-TABS) 100 MG tablet    Sig: Take 1 tablet (100 mg total) by mouth 2 (two) times daily.    Dispense:  20 tablet    Refill:  0   Patient Instructions   With new wound on lower leg, can try initial treatment with doxycycline as still may be reactive lymph node form skin infection. Cleanse wound with  soap and water twice per day and keep it covered.  If that area is not improving in next week to 10 days, we can check ultrasound or other imaging. Return to the clinic or go to the nearest emergency room if any of your symptoms worsen or new symptoms occur.  Lymphadenopathy Lymphadenopathy refers to swollen or enlarged lymph glands, also called lymph nodes. Lymph glands are part of your body's defense (immune) system, which protects the body from infections,  germs, and diseases. Lymph glands are found in many locations in your body, including the neck, underarm, and groin. Many things can cause lymph glands to become enlarged. When your immune system responds to germs, such as viruses or bacteria, infection-fighting cells and fluid build up. This causes the glands to grow in size. Usually, this is not something to worry about. The swelling and any soreness often go away without treatment. However, swollen lymph glands can also be caused by a number of diseases. Your health care provider may do various tests to help determine the cause. If the cause of your swollen lymph glands cannot be found, it is important to monitor your condition to make sure the swelling goes away. Follow these instructions at home: Watch your condition for any changes. The following actions may help to lessen any discomfort you are feeling:  Get plenty of rest.  Take medicines only as directed by your health care provider. Your health care provider may recommend over-the-counter medicines for pain.  Apply moist heat compresses to the site of swollen lymph nodes as directed by your health care provider. This can help reduce any pain.  Check your lymph nodes daily for any changes.  Keep all follow-up visits as directed by your health care provider. This is important.  Contact a health care provider if:  Your lymph nodes are still swollen after 2 weeks.  Your swelling increases or spreads to other areas.  Your lymph  nodes are hard, seem fixed to the skin, or are growing rapidly.  Your skin over the lymph nodes is red and inflamed.  You have a fever.  You have chills.  You have fatigue.  You develop a sore throat.  You have abdominal pain.  You have weight loss.  You have night sweats. Get help right away if:  You notice fluid leaking from the area of the enlarged lymph node.  You have severe pain in any area of your body.  You have chest pain.  You have shortness of breath. This information is not intended to replace advice given to you by your health care provider. Make sure you discuss any questions you have with your health care provider. Document Released: 12/26/2007 Document Revised: 08/24/2015 Document Reviewed: 10/21/2013 Elsevier Interactive Patient Education  2018 [employer]     IF you received an x-ray today, you will receive an invoice from [facility]. Please contact [facility] at [phone] with questions or concerns regarding your invoice.   IF you received labwork today, you will receive an invoice from [facility]. Please contact [facility] at [phone] with questions or concerns regarding your invoice.   Our billing staff will not be able to assist you with questions regarding bills from these companies.  You will be contacted with the lab results as soon as they are available. The fastest way to get your results is to activate your My Chart account. Instructions are located on the last page of this paperwork. If you have not heard from us regarding the results in 2 weeks, please contact this office.       I personally performed the services described in this documentation, which was scribed in my presence. The recorded information has been reviewed and considered for accuracy and completeness, addended by me as needed, and agree with information above.  Signed,   Meredith StaggersJeffrey Dusty Wagoner, MD Primary Care at [facility].    11/16/17 9:17 PM

## 2017-11-11 NOTE — Patient Instructions (Addendum)
With new wound on lower leg, can try initial treatment with doxycycline as still may be reactive lymph node form skin infection. Cleanse wound with soap and water twice per day and keep it covered.  If that area is not improving in next week to 10 days, we can check ultrasound or other imaging. Return to the clinic or go to the nearest emergency room if any of your symptoms worsen or new symptoms occur.  Lymphadenopathy Lymphadenopathy refers to swollen or enlarged lymph glands, also called lymph nodes. Lymph glands are part of your body's defense (immune) system, which protects the body from infections, germs, and diseases. Lymph glands are found in many locations in your body, including the neck, underarm, and groin. Many things can cause lymph glands to become enlarged. When your immune system responds to germs, such as viruses or bacteria, infection-fighting cells and fluid build up. This causes the glands to grow in size. Usually, this is not something to worry about. The swelling and any soreness often go away without treatment. However, swollen lymph glands can also be caused by a number of diseases. Your health care provider may do various tests to help determine the cause. If the cause of your swollen lymph glands cannot be found, it is important to monitor your condition to make sure the swelling goes away. Follow these instructions at home: Watch your condition for any changes. The following actions may help to lessen any discomfort you are feeling:  Get plenty of rest.  Take medicines only as directed by your health care provider. Your health care provider may recommend over-the-counter medicines for pain.  Apply moist heat compresses to the site of swollen lymph nodes as directed by your health care provider. This can help reduce any pain.  Check your lymph nodes daily for any changes.  Keep all follow-up visits as directed by your health care provider. This is important.  Contact a  health care provider if:  Your lymph nodes are still swollen after 2 weeks.  Your swelling increases or spreads to other areas.  Your lymph nodes are hard, seem fixed to the skin, or are growing rapidly.  Your skin over the lymph nodes is red and inflamed.  You have a fever.  You have chills.  You have fatigue.  You develop a sore throat.  You have abdominal pain.  You have weight loss.  You have night sweats. Get help right away if:  You notice fluid leaking from the area of the enlarged lymph node.  You have severe pain in any area of your body.  You have chest pain.  You have shortness of breath. This information is not intended to replace advice given to you by your health care provider. Make sure you discuss any questions you have with your health care provider. Document Released: 12/26/2007 Document Revised: 08/24/2015 Document Reviewed: 10/21/2013 Elsevier Interactive Patient Education  2018 [employer]     IF you received an x-ray today, you will receive an invoice from [facility]. Please contact [facility] at [phone] with questions or concerns regarding your invoice.   IF you received labwork today, you will receive an invoice from [facility]. Please contact [facility] at [phone] with questions or concerns regarding your invoice.   Our billing staff will not be able to assist you with questions regarding bills from these companies.  You will be contacted with the lab results as soon as they are available. The fastest way to get your results is to  activate your My Chart account. Instructions are located on the last page of this paperwork. If you have not heard from [place] regarding the results in 2 weeks, please contact this office.

## 2017-11-16 ENCOUNTER — Encounter: Payer: Self-pay | Admitting: Family Medicine

## 2017-11-26 DIAGNOSIS — R7612 Nonspecific reaction to cell mediated immunity measurement of gamma interferon antigen response without active tuberculosis: Secondary | ICD-10-CM | POA: Diagnosis not present

## 2017-12-06 IMAGING — CT CT HEAD W/O CM
3 series · 14 of 47 positions shown, 16 images · non-contrast
Comparison: None.

CLINICAL DATA: Fall, left head injury

EXAM:
CT HEAD WITHOUT CONTRAST
TECHNIQUE: Contiguous axial images were obtained from the base of the skull
through the vertex without intravenous contrast.

[Series 2: head wo · axial · 0.45mm/px · z∈[+708,+833]mm · 8 of 30 slices shown, 10 images]
[im 3/30  brain]
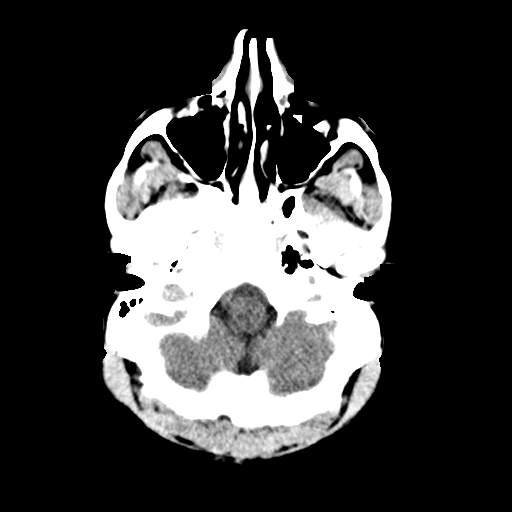
[im 3/30  bone]
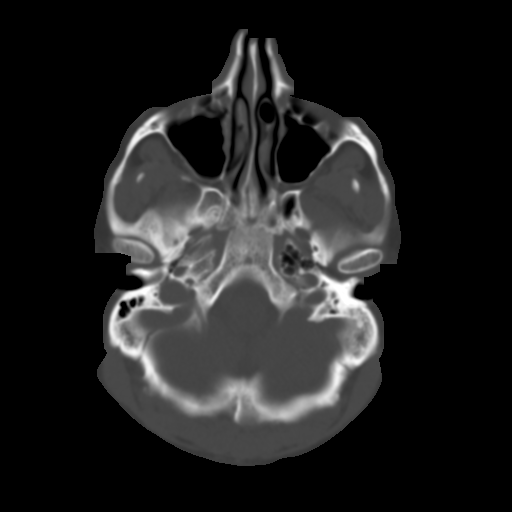
[im 7/30  brain]
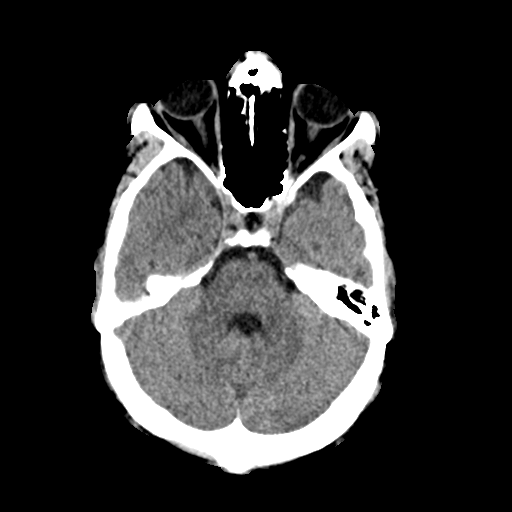
[im 10/30  brain]
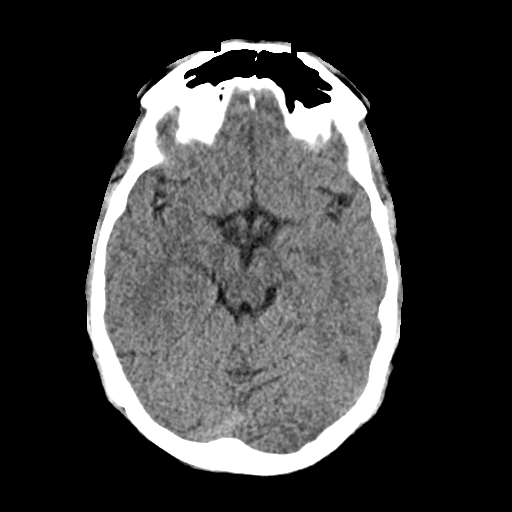
[im 14/30  brain]
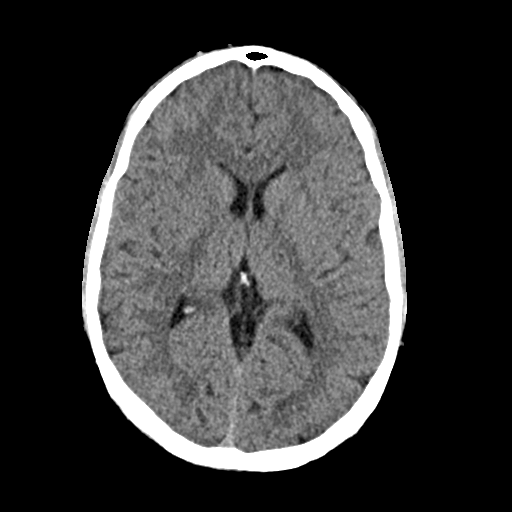
[im 17/30  brain]
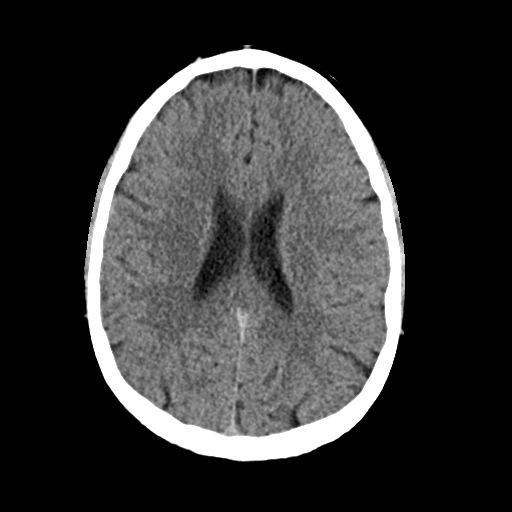
[im 17/30  bone]
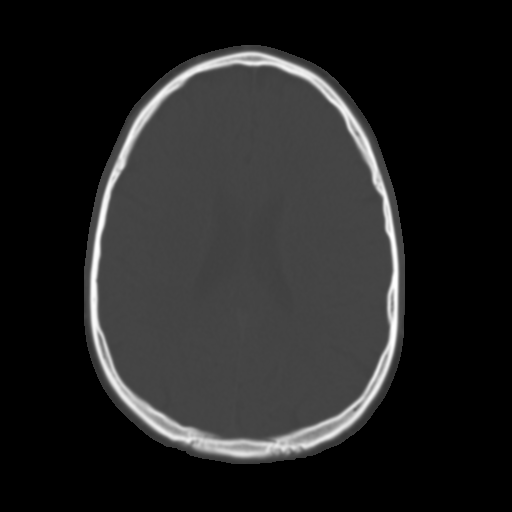
[im 21/30  brain]
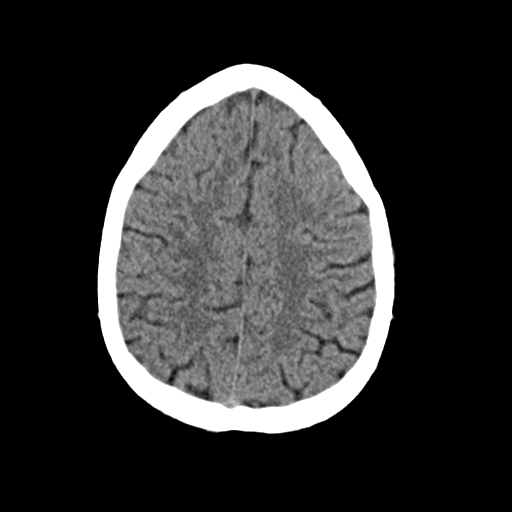
[im 24/30  brain]
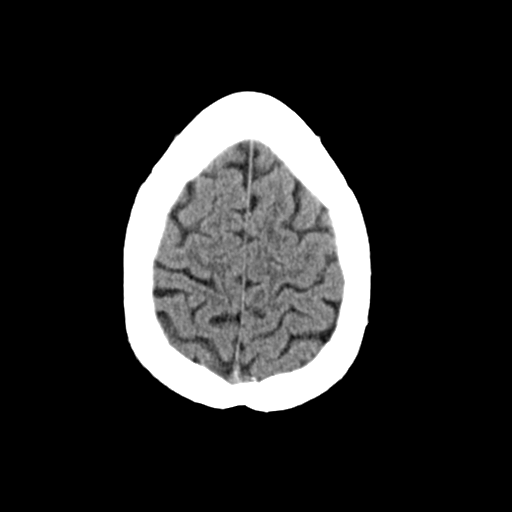
[im 28/30  brain]
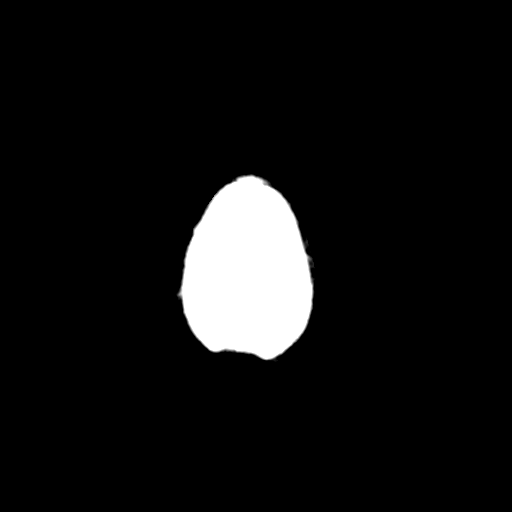

[Series 4: coronal soft · coronal · 0.29mm/px · 3 of 67 slices shown]
[im 23/67  brain]
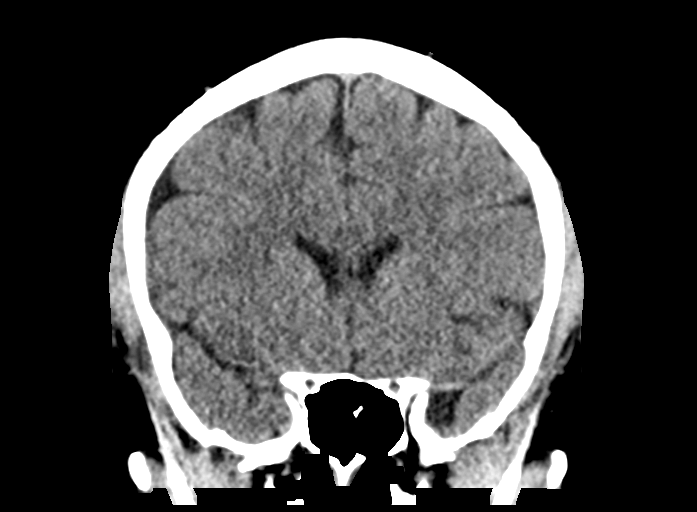
[im 30/67  brain]
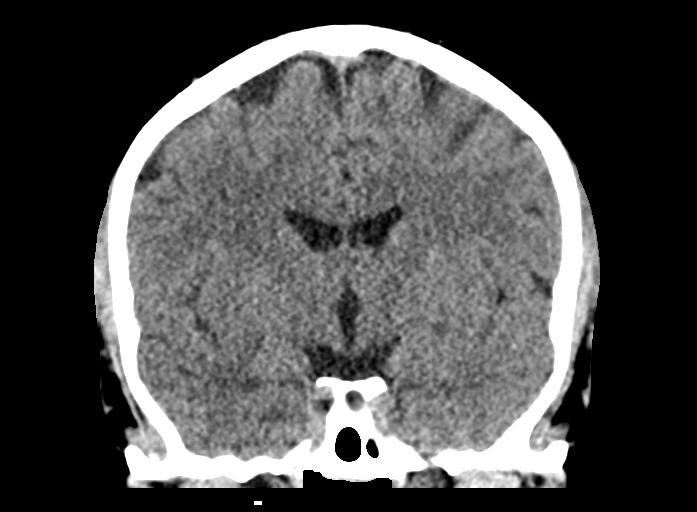
[im 37/67  brain]
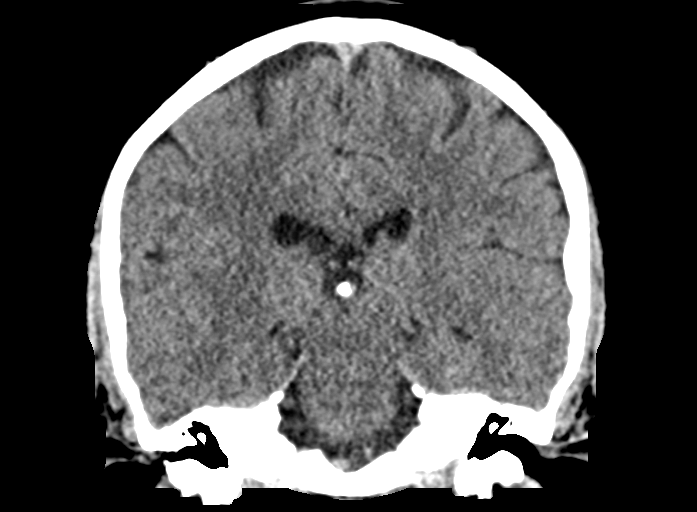

[Series 5: sag soft · sagittal · 0.28mm/px · 3 of 56 slices shown]
[im 19/56  brain]
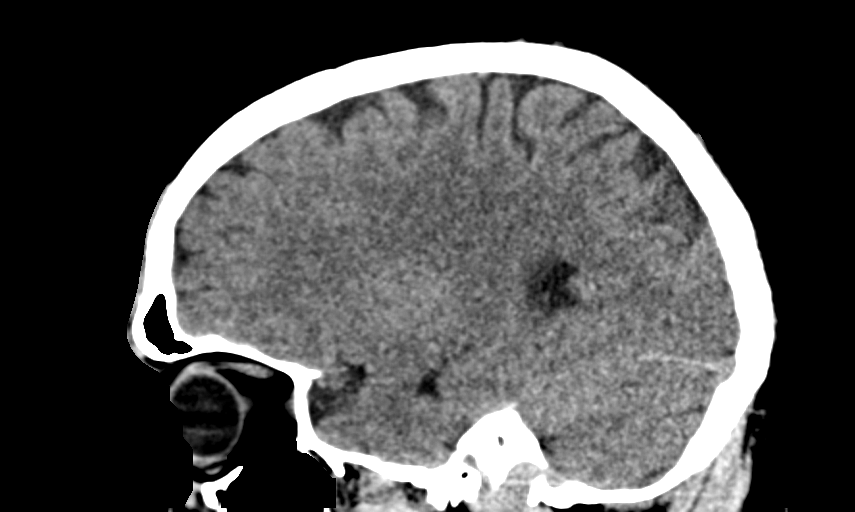
[im 28/56  brain]
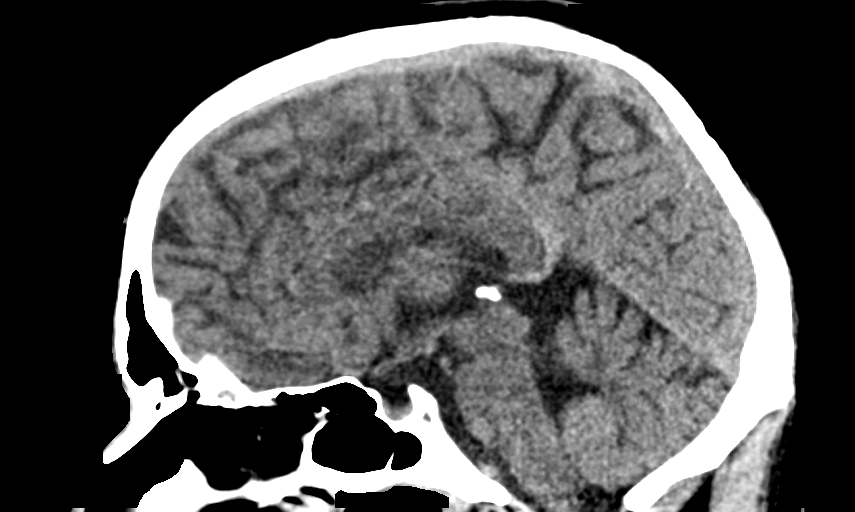
[im 37/56  brain]
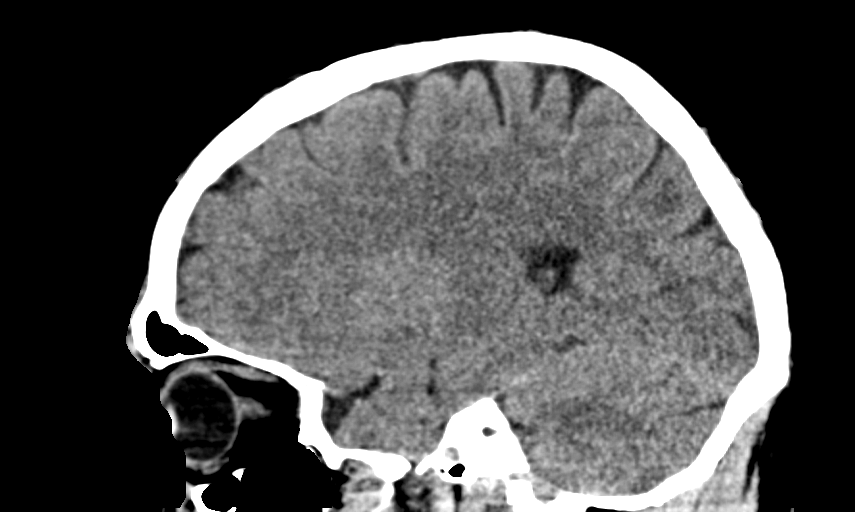

[14 of 47 positions shown; findings below may reference images not displayed]

FINDINGS: Brain: No evidence of acute infarction, hemorrhage, hydrocephalus,
extra-axial collection or mass lesion/mass effect.

Vascular: No hyperdense vessel or unexpected calcification.

Skull: Normal. Negative for fracture or focal lesion.

Sinuses/Orbits: The visualized paranasal sinuses are essentially
clear. The mastoid air cells are unopacified.

Other: None.
IMPRESSION: Normal head CT.

## 2017-12-17 DIAGNOSIS — F901 Attention-deficit hyperactivity disorder, predominantly hyperactive type: Secondary | ICD-10-CM | POA: Diagnosis not present

## 2018-02-18 DIAGNOSIS — F901 Attention-deficit hyperactivity disorder, predominantly hyperactive type: Secondary | ICD-10-CM | POA: Diagnosis not present

## 2018-04-29 DIAGNOSIS — F901 Attention-deficit hyperactivity disorder, predominantly hyperactive type: Secondary | ICD-10-CM | POA: Diagnosis not present

## 2018-08-12 DIAGNOSIS — F901 Attention-deficit hyperactivity disorder, predominantly hyperactive type: Secondary | ICD-10-CM | POA: Diagnosis not present

## 2018-10-31 DIAGNOSIS — J029 Acute pharyngitis, unspecified: Secondary | ICD-10-CM | POA: Diagnosis not present

## 2018-10-31 DIAGNOSIS — R070 Pain in throat: Secondary | ICD-10-CM | POA: Diagnosis not present

## 2018-11-02 DIAGNOSIS — J039 Acute tonsillitis, unspecified: Secondary | ICD-10-CM | POA: Diagnosis not present

## 2018-11-02 DIAGNOSIS — R5383 Other fatigue: Secondary | ICD-10-CM | POA: Diagnosis not present

## 2018-11-02 DIAGNOSIS — R070 Pain in throat: Secondary | ICD-10-CM | POA: Diagnosis not present

## 2018-11-02 DIAGNOSIS — J029 Acute pharyngitis, unspecified: Secondary | ICD-10-CM | POA: Diagnosis not present

## 2018-11-02 DIAGNOSIS — J36 Peritonsillar abscess: Secondary | ICD-10-CM | POA: Diagnosis not present

## 2018-11-02 DIAGNOSIS — R509 Fever, unspecified: Secondary | ICD-10-CM | POA: Diagnosis not present

## 2018-11-02 DIAGNOSIS — F17210 Nicotine dependence, cigarettes, uncomplicated: Secondary | ICD-10-CM | POA: Diagnosis not present

## 2018-11-09 IMAGING — DX DG CHEST 2V
2 series · 2 of 2 positions shown · non-contrast
Comparison: 04/11/2016

CLINICAL DATA: Cough.  Wheezing.  Mold exposure.

EXAM:
CHEST - 2 VIEW

[chest pa]
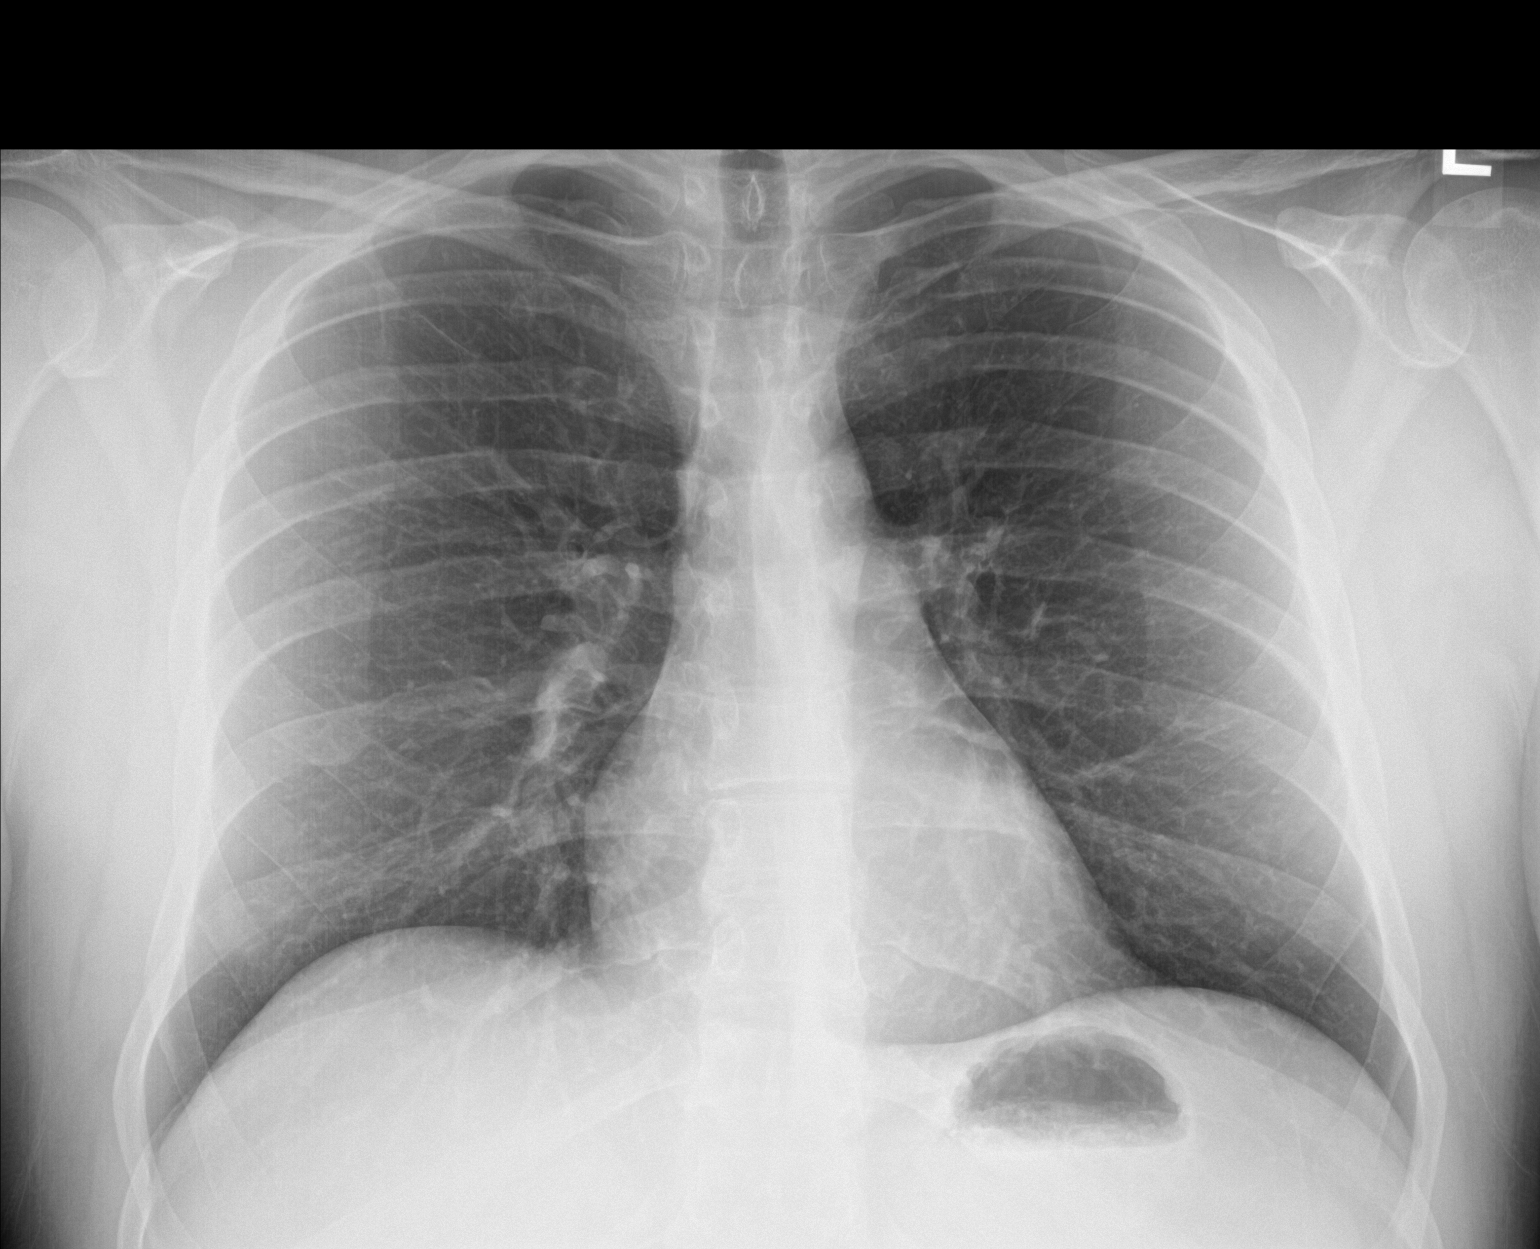

[chest lat]
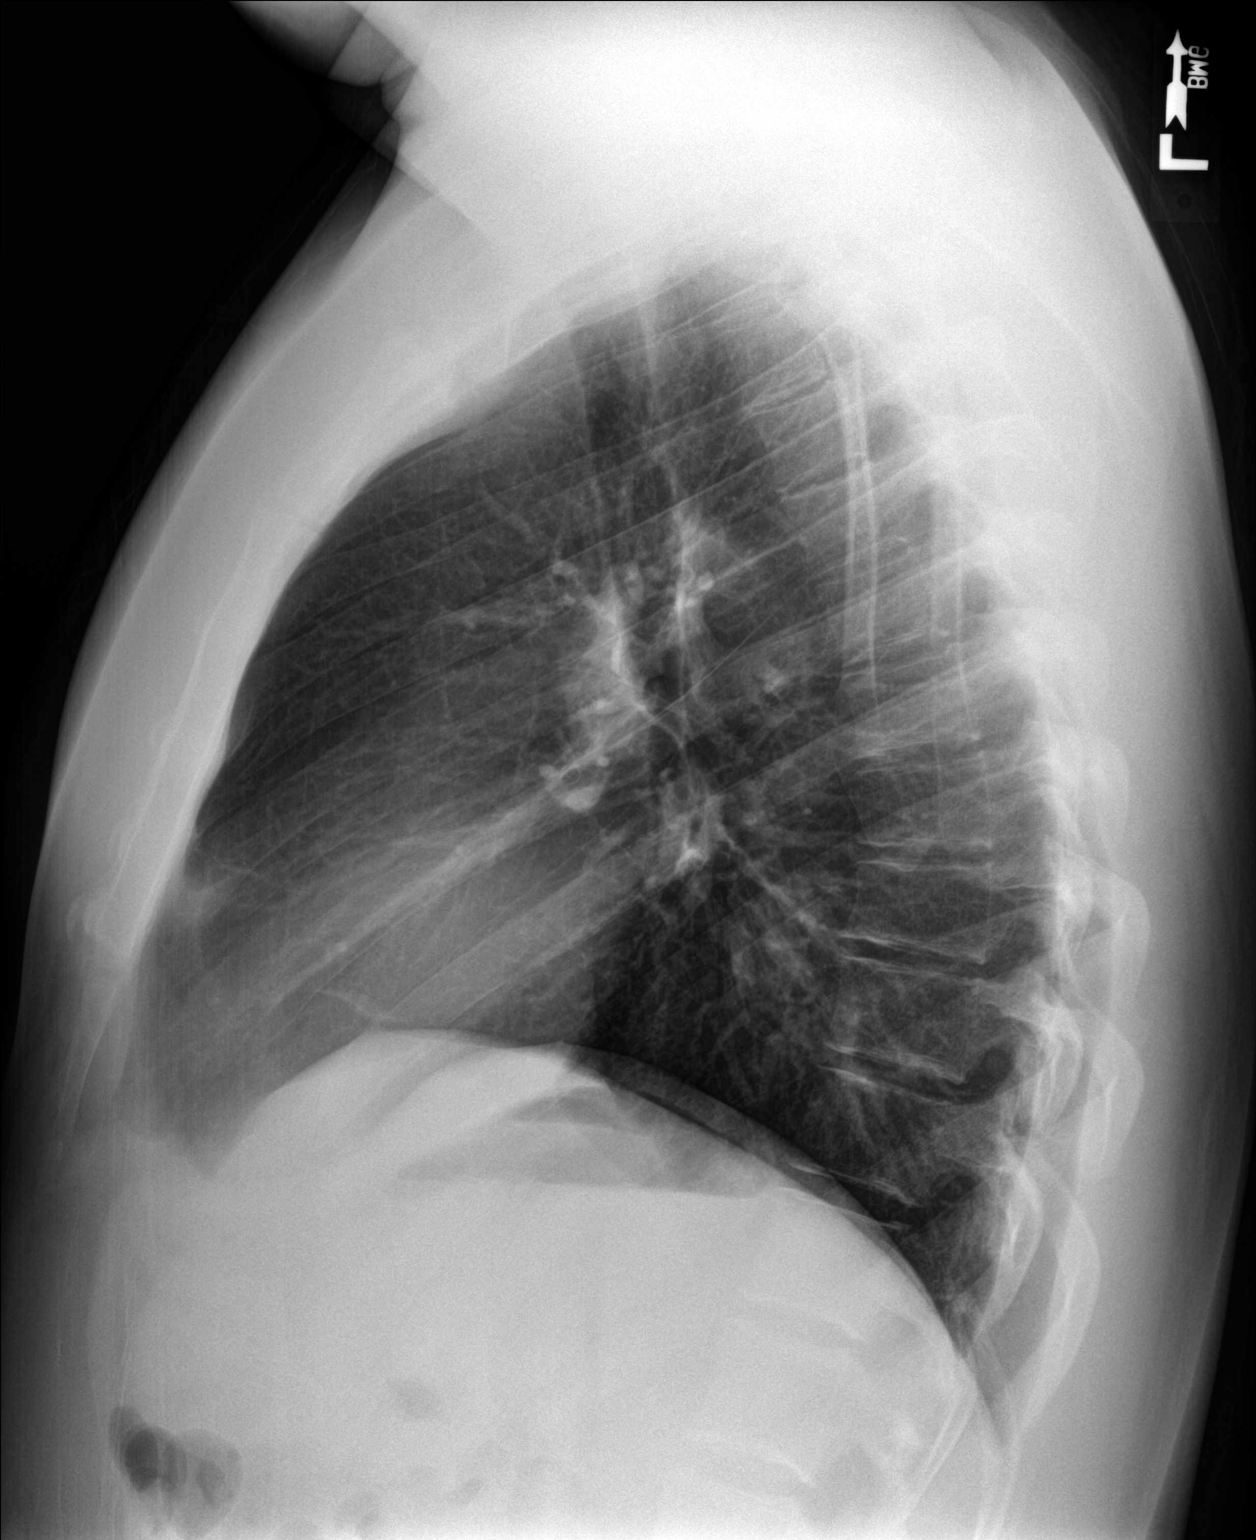

[2 of 2 positions shown; findings below may reference images not displayed]

FINDINGS: Midline trachea.  Normal heart size and mediastinal contours.

Sharp costophrenic angles.  No pneumothorax.  Clear lungs.
IMPRESSION: No active cardiopulmonary disease.

## 2018-11-13 DIAGNOSIS — J36 Peritonsillar abscess: Secondary | ICD-10-CM | POA: Diagnosis not present

## 2018-11-25 DIAGNOSIS — F901 Attention-deficit hyperactivity disorder, predominantly hyperactive type: Secondary | ICD-10-CM | POA: Diagnosis not present

## 2019-02-05 ENCOUNTER — Other Ambulatory Visit: Payer: Self-pay

## 2019-02-05 DIAGNOSIS — Z20822 Contact with and (suspected) exposure to covid-19: Secondary | ICD-10-CM

## 2019-02-07 LAB — NOVEL CORONAVIRUS, NAA: SARS-CoV-2, NAA: NOT DETECTED

## 2019-02-10 ENCOUNTER — Other Ambulatory Visit: Payer: Self-pay

## 2019-02-10 DIAGNOSIS — Z20822 Contact with and (suspected) exposure to covid-19: Secondary | ICD-10-CM

## 2019-02-12 LAB — NOVEL CORONAVIRUS, NAA: SARS-CoV-2, NAA: NOT DETECTED

## 2019-02-22 DIAGNOSIS — Z20828 Contact with and (suspected) exposure to other viral communicable diseases: Secondary | ICD-10-CM | POA: Diagnosis not present

## 2019-02-22 DIAGNOSIS — Z03818 Encounter for observation for suspected exposure to other biological agents ruled out: Secondary | ICD-10-CM | POA: Diagnosis not present

## 2019-03-10 DIAGNOSIS — Z20828 Contact with and (suspected) exposure to other viral communicable diseases: Secondary | ICD-10-CM | POA: Diagnosis not present

## 2019-03-10 DIAGNOSIS — Z20818 Contact with and (suspected) exposure to other bacterial communicable diseases: Secondary | ICD-10-CM | POA: Diagnosis not present

## 2019-03-11 DIAGNOSIS — Z20818 Contact with and (suspected) exposure to other bacterial communicable diseases: Secondary | ICD-10-CM | POA: Diagnosis not present

## 2019-03-23 DIAGNOSIS — F901 Attention-deficit hyperactivity disorder, predominantly hyperactive type: Secondary | ICD-10-CM | POA: Diagnosis not present

## 2019-06-29 DIAGNOSIS — F901 Attention-deficit hyperactivity disorder, predominantly hyperactive type: Secondary | ICD-10-CM | POA: Diagnosis not present

## 2019-09-24 DIAGNOSIS — R0781 Pleurodynia: Secondary | ICD-10-CM | POA: Diagnosis not present

## 2019-09-24 DIAGNOSIS — S46819A Strain of other muscles, fascia and tendons at shoulder and upper arm level, unspecified arm, initial encounter: Secondary | ICD-10-CM | POA: Diagnosis not present

## 2019-09-29 DIAGNOSIS — F901 Attention-deficit hyperactivity disorder, predominantly hyperactive type: Secondary | ICD-10-CM | POA: Diagnosis not present

## 2022-05-21 ENCOUNTER — Emergency Department (HOSPITAL_COMMUNITY)
Admission: EM | Admit: 2022-05-21 | Discharge: 2022-05-21 | Disposition: A | Discharge status: Home / Self Care | Payer: BC Managed Care – PPO | Attending: Emergency Medicine | Admitting: Emergency Medicine

## 2022-05-21 ENCOUNTER — Other Ambulatory Visit: Payer: Self-pay

## 2022-05-21 ENCOUNTER — Emergency Department (HOSPITAL_COMMUNITY): Payer: BC Managed Care – PPO

## 2022-05-21 DIAGNOSIS — R0789 Other chest pain: Secondary | ICD-10-CM | POA: Diagnosis not present

## 2022-05-21 DIAGNOSIS — R079 Chest pain, unspecified: Secondary | ICD-10-CM | POA: Diagnosis present

## 2022-05-21 LAB — BASIC METABOLIC PANEL
Anion gap: 9 (ref 5–15)
BUN: 12 mg/dL (ref 6–20)
CO2: 23 mmol/L (ref 22–32)
Calcium: 9.2 mg/dL (ref 8.9–10.3)
Chloride: 105 mmol/L (ref 98–111)
Creatinine, Ser: 0.89 mg/dL (ref 0.61–1.24)
GFR, Estimated: >60 mL/min (ref >60)
Glucose, Bld: 120 mg/dL — ABNORMAL HIGH (ref 70–99)
Potassium: 3.6 mmol/L (ref 3.5–5.1)
Sodium: 137 mmol/L (ref 135–145)

## 2022-05-21 LAB — CBC
HCT: 41 % (ref 39.0–52.0)
Hemoglobin: 14 g/dL (ref 13.0–17.0)
MCH: 31 pg (ref 26.0–34.0)
MCHC: 34.1 g/dL (ref 30.0–36.0)
MCV: 90.7 fL (ref 80.0–100.0)
Platelets: 333 10*3/uL (ref 150–400)
RBC: 4.52 MIL/uL (ref 4.22–5.81)
RDW: 12.2 % (ref 11.5–15.5)
WBC: 10.6 10*3/uL — ABNORMAL HIGH (ref 4.0–10.5)
nRBC: 0 % (ref 0.0–0.2)

## 2022-05-21 LAB — TROPONIN I (HIGH SENSITIVITY)
Troponin I (High Sensitivity): 3 ng/L (ref <18)
Troponin I (High Sensitivity): 3 ng/L (ref <18)

## 2022-05-21 MED ORDER — IOHEXOL 350 MG/ML SOLN
100.0000 mL | Freq: Once | INTRAVENOUS | Status: AC | PRN
  Administered 2022-05-21: 100 mL via INTRAVENOUS

## 2022-05-21 NOTE — Discharge Instructions (Signed)
CLINICAL DATA:  Intermittent left chest pain with left upper extremity and back radiation.   EXAM: CT ANGIOGRAPHY CHEST, ABDOMEN AND PELVIS   TECHNIQUE: Initial noncontrast axial chest CT at 5 mm intervals was obtained.   Multidetector CT imaging through the chest, abdomen and pelvis was performed using the standard protocol during bolus administration of intravenous contrast. Multiplanar reconstructed images and MIPs were obtained and reviewed to evaluate the vascular anatomy.   RADIATION DOSE REDUCTION: This exam was performed according to the departmental dose-optimization program which includes automated exposure control, adjustment of the mA and/or kV according to patient size and/or use of iterative reconstruction technique.   CONTRAST:  174m OMNIPAQUE IOHEXOL 350 MG/ML SOLN   COMPARISON:  PA Lat chest today, PA chest 09/24/2019. No prior cross-sectional imaging for comparison.   FINDINGS: CTA CHEST FINDINGS   Cardiovascular: The cardiac size is normal. There is no pericardial effusion. There are no visible coronary artery or aortic calcific or soft plaques. The aorta and great vessels are normal in course, branching and caliber without dissections or aneurysms.   There is a normal variant origin of the left vertebral artery from the aortic arch. The pulmonary veins are decompressed.   The pulmonary trunk is slightly prominent at 3.2 cm concerning for pulmonary arterial hypertension, but no arterial embolus is seen through the segmental divisions.   Mediastinum/Nodes: No enlarged mediastinal, hilar, or axillary lymph nodes. Thyroid gland, trachea, and esophagus demonstrate no significant findings. There is mild mediastinal lipomatosis.   Lungs/Pleura: No pleural effusion, thickening or pneumothorax. There is mild bronchial thickening in the lower lobes. No focal pneumonia or nodule is seen. There is mild elevation right hemidiaphragm.   Musculoskeletal: No chest  wall abnormality. No acute or significant osseous findings.   Review of the MIP images confirms the above findings.   CTA ABDOMEN AND PELVIS FINDINGS   VASCULAR   Aorta: Normal caliber aorta without aneurysm, dissection, vasculitis or significant stenosis.   Celiac: Normal.   SMA: Normal.   Renals: Normal.   IMA: Normal.   Inflow/proximal outflow: Normal.   Veins: The main portal vein, suprarenal IVC and renal veins are faintly opacified without filling defects. Other veins are unopacified and not evaluated.   Review of the MIP images confirms the above findings.   NON-VASCULAR   Hepatobiliary: The liver demonstrates moderate steatosis and measures 18.5 cm length. No mass is seen.   Pancreas: No abnormality.   Spleen: No abnormality.   Adrenals/Urinary Tract: Adrenal glands are unremarkable. Kidneys are normal, without renal calculi, focal lesion, or hydronephrosis. Bladder is unremarkable.   Stomach/Bowel: No dilatation or wall thickening including the appendix.   Lymphatic: No lymphadenopathy is seen.   Reproductive: Prostate is unremarkable.   Other: There is a small umbilical fat hernia. There are bilateral inguinal hernia repairs which appear grossly intact. There is no incarcerated hernia. There is no free air, free hemorrhage, or free fluid.   Musculoskeletal: No acute or significant osseous findings.   Review of the MIP images confirms the above findings.   IMPRESSION: 1. No acute CT or CTA findings in the chest, abdomen or pelvis. 2. Slightly prominent pulmonary trunk at 3.2 cm, concerning for pulmonary arterial hypertension but no arterial embolus is seen through the segmental divisions. 3. Mild bronchial thickening in the lower lobes, could be due to bronchitis or reactive airways disease. No focal pneumonia. 4. Moderate hepatic steatosis. 5. Umbilical fat hernia. 6. Intact-appearing bilateral inguinal hernia repairs.

## 2022-05-21 NOTE — ED Provider Notes (Signed)
[facility] Provider Note   CSN: [id number] Arrival date & time: 05/21/22  0113     History  Chief Complaint  Patient presents with   Chest Pain    Jack Jensen is a 32 y.o. male.  The history is provided by the patient.  Chest Pain Jack Jensen is a 32 y.o. male who presents to the Emergency Department complaining of chest pain.  He presents to the emergency department for evaluation of severe left-sided chest pain that started at midnight.  He describes it as a ripping sensation that kept him from sleeping that radiated to his back.  The episode lasted several minutes.  He had associated heavy breathing during the episode.  No associated fevers, nausea, vomiting, abdominal pain, leg swelling or pain.  He does report feeling weak related to the episode.  No prior similar symptoms.  No tobacco.  Occasional alcohol, no drugs.  Family history of cardiac disease in his grandfather.  History of diabetes in his mother.  No known medical problems but he does not currently have a doctor.     Home Medications Prior to Admission medications   Medication Sig Start Date End Date Taking? Authorizing Provider  doxycycline (VIBRA-TABS) 100 MG tablet Take 1 tablet (100 mg total) by mouth 2 (two) times daily. 11/11/17   Wendie Agreste, MD  methylphenidate (RITALIN) 10 MG tablet Take by mouth.    [provider]      Allergies    Cephalexin    Review of Systems   Review of Systems  Cardiovascular:  Positive for chest pain.  All other systems reviewed and are negative.   Physical Exam Updated Vital Signs BP 112/60 (BP Location: Right Arm)   Pulse 84   Temp 98.6 F (37 C) (Oral)   Resp 15   SpO2 98%  Physical Exam Vitals and nursing note reviewed.  Constitutional:      Appearance: He is well-developed.  HENT:     Head: Normocephalic and atraumatic.  Cardiovascular:     Rate and Rhythm: Normal rate and regular rhythm.      Heart sounds: No murmur heard. Pulmonary:     Effort: Pulmonary effort is normal. No respiratory distress.     Breath sounds: Normal breath sounds.  Abdominal:     Palpations: Abdomen is soft.     Tenderness: There is no abdominal tenderness. There is no guarding or rebound.  Musculoskeletal:        General: No swelling or tenderness.     Comments: 2+ DP pulses bilaterally  Skin:    General: Skin is warm and dry.  Neurological:     Mental Status: He is alert and oriented to person, place, and time.  Psychiatric:        Behavior: Behavior normal.     ED Results / Procedures / Treatments   Labs (all labs ordered are listed, but only abnormal results are displayed) Labs Reviewed  BASIC METABOLIC PANEL - Abnormal; Notable for the following components:      Result Value   Glucose, Bld 120 (*)    All other components within normal limits  CBC - Abnormal; Notable for the following components:   WBC 10.6 (*)    All other components within normal limits  TROPONIN I (HIGH SENSITIVITY)  TROPONIN I (HIGH SENSITIVITY)    EKG EKG Interpretation  Date/Time:  Tuesday May 21 2022 01:16:13 EST Ventricular Rate:  81 PR Interval:  134 QRS Duration: 86 QT Interval:  378 QTC Calculation: 439 R Axis:   58 Text Interpretation: Normal sinus rhythm Normal ECG No previous ECGs available Confirmed by Quintella Reichert [phone]) on 05/21/2022 1:59:52 AM  Radiology CT Angio Chest/Abd/Pel for Dissection W and/or W/WO  Result Date: 05/21/2022 CLINICAL DATA:  Intermittent left chest pain with left upper extremity and back radiation. EXAM: CT ANGIOGRAPHY CHEST, ABDOMEN AND PELVIS TECHNIQUE: Initial noncontrast axial chest CT at 5 mm intervals was obtained. Multidetector CT imaging through the chest, abdomen and pelvis was performed using the standard protocol during bolus administration of intravenous contrast. Multiplanar reconstructed images and MIPs were obtained and reviewed to evaluate the  vascular anatomy. RADIATION DOSE REDUCTION: This exam was performed according to the departmental dose-optimization program which includes automated exposure control, adjustment of the mA and/or kV according to patient size and/or use of iterative reconstruction technique. CONTRAST:  145m OMNIPAQUE IOHEXOL 350 MG/ML SOLN COMPARISON:  PA Lat chest today, PA chest 09/24/2019. No prior cross-sectional imaging for comparison. FINDINGS: CTA CHEST FINDINGS Cardiovascular: The cardiac size is normal. There is no pericardial effusion. There are no visible coronary artery or aortic calcific or soft plaques. The aorta and great vessels are normal in course, branching and caliber without dissections or aneurysms. There is a normal variant origin of the left vertebral artery from the aortic arch. The pulmonary veins are decompressed. The pulmonary trunk is slightly prominent at 3.2 cm concerning for pulmonary arterial hypertension, but no arterial embolus is seen through the segmental divisions. Mediastinum/Nodes: No enlarged mediastinal, hilar, or axillary lymph nodes. Thyroid gland, trachea, and esophagus demonstrate no significant findings. There is mild mediastinal lipomatosis. Lungs/Pleura: No pleural effusion, thickening or pneumothorax. There is mild bronchial thickening in the lower lobes. No focal pneumonia or nodule is seen. There is mild elevation right hemidiaphragm. Musculoskeletal: No chest wall abnormality. No acute or significant osseous findings. Review of the MIP images confirms the above findings. CTA ABDOMEN AND PELVIS FINDINGS VASCULAR Aorta: Normal caliber aorta without aneurysm, dissection, vasculitis or significant stenosis. Celiac: Normal. SMA: Normal. Renals: Normal. IMA: Normal. Inflow/proximal outflow: Normal. Veins: The main portal vein, suprarenal IVC and renal veins are faintly opacified without filling defects. Other veins are unopacified and not evaluated. Review of the MIP images confirms the  above findings. NON-VASCULAR Hepatobiliary: The liver demonstrates moderate steatosis and measures 18.5 cm length. No mass is seen. Pancreas: No abnormality. Spleen: No abnormality. Adrenals/Urinary Tract: Adrenal glands are unremarkable. Kidneys are normal, without renal calculi, focal lesion, or hydronephrosis. Bladder is unremarkable. Stomach/Bowel: No dilatation or wall thickening including the appendix. Lymphatic: No lymphadenopathy is seen. Reproductive: Prostate is unremarkable. Other: There is a small umbilical fat hernia. There are bilateral inguinal hernia repairs which appear grossly intact. There is no incarcerated hernia. There is no free air, free hemorrhage, or free fluid. Musculoskeletal: No acute or significant osseous findings. Review of the MIP images confirms the above findings. IMPRESSION: 1. No acute CT or CTA findings in the chest, abdomen or pelvis. 2. Slightly prominent pulmonary trunk at 3.2 cm, concerning for pulmonary arterial hypertension but no arterial embolus is seen through the segmental divisions. 3. Mild bronchial thickening in the lower lobes, could be due to bronchitis or reactive airways disease. No focal pneumonia. 4. Moderate hepatic steatosis. 5. Umbilical fat hernia. 6. Intact-appearing bilateral inguinal hernia repairs. Electronically Signed   By: KTelford NabM.D.   On: 05/21/2022 04:49   DG Chest 2 View  Result Date: 05/21/2022 CLINICAL  DATA:  Chest pain the left, initial encounter EXAM: CHEST - 2 VIEW COMPARISON:  09/24/2019 FINDINGS: The heart size and mediastinal contours are within normal limits. Both lungs are clear. The visualized skeletal structures are unremarkable. IMPRESSION: No active cardiopulmonary disease. Electronically Signed   By: Inez Catalina M.D.   On: 05/21/2022 01:44    Procedures Procedures    Medications Ordered in ED Medications  iohexol (OMNIPAQUE) 350 MG/ML injection 100 mL (100 mLs Intravenous Contrast Given 05/21/22 0405)    ED  Course/ Medical Decision Making/ A&P                             Medical Decision Making Amount and/or Complexity of Data Reviewed Labs: ordered. Radiology: ordered.  Risk Prescription drug management.   Patient here for evaluation of chest pain that occurred prior to ED arrival.  Pain is essentially gone at time of assessment.  EKG is without acute ischemic changes and troponins are negative x 2.  Given description and pattern of pain a CTA dissection protocol was obtained.  CTA is negative for dissection but does demonstrate multiple incidental findings that do not appear to be contributing to his recent pain.  Discussed with patient incidental findings of enlarged pulmonary artery, hepatic steatosis.  Discussed PCP follow-up as well as return precautions.  Current clinical picture is not consistent with ACS.  No evidence of dissection, pneumonia, PE.        Final Clinical Impression(s) / ED Diagnoses Final diagnoses:  Atypical chest pain    Rx / DC Orders ED Discharge Orders     None         Quintella Reichert, MD 05/21/22 [phone]

## 2022-05-21 NOTE — ED Triage Notes (Signed)
Patient reports intermittent left upper chest pain radiating to left arm and upper back this evening , denies SOB , no emesis or diaphoresis .

## 2022-05-22 ENCOUNTER — Other Ambulatory Visit: Payer: Self-pay
